# Patient Record
Sex: Female | Born: 1940 | Race: Black or African American | Hispanic: No | Smoking: Never smoker
Health system: Southern US, Community
[De-identification: ages and names within clinical notes are randomized; demographics above are authoritative.]

## PROBLEM LIST (undated history)

## (undated) DIAGNOSIS — I10 Essential (primary) hypertension: Secondary | ICD-10-CM

## (undated) DIAGNOSIS — C801 Malignant (primary) neoplasm, unspecified: Secondary | ICD-10-CM

---

## 2019-02-06 ENCOUNTER — Other Ambulatory Visit: Payer: Self-pay

## 2019-02-06 ENCOUNTER — Encounter: Payer: Self-pay | Admitting: Emergency Medicine

## 2019-02-06 DIAGNOSIS — Z85118 Personal history of other malignant neoplasm of bronchus and lung: Secondary | ICD-10-CM | POA: Diagnosis not present

## 2019-02-06 DIAGNOSIS — Z923 Personal history of irradiation: Secondary | ICD-10-CM | POA: Insufficient documentation

## 2019-02-06 DIAGNOSIS — Z20828 Contact with and (suspected) exposure to other viral communicable diseases: Secondary | ICD-10-CM | POA: Diagnosis not present

## 2019-02-06 DIAGNOSIS — I82B12 Acute embolism and thrombosis of left subclavian vein: Secondary | ICD-10-CM | POA: Diagnosis not present

## 2019-02-06 DIAGNOSIS — J90 Pleural effusion, not elsewhere classified: Secondary | ICD-10-CM | POA: Insufficient documentation

## 2019-02-06 DIAGNOSIS — M6281 Muscle weakness (generalized): Secondary | ICD-10-CM | POA: Diagnosis not present

## 2019-02-06 DIAGNOSIS — I10 Essential (primary) hypertension: Secondary | ICD-10-CM | POA: Insufficient documentation

## 2019-02-06 DIAGNOSIS — Z902 Acquired absence of lung [part of]: Secondary | ICD-10-CM | POA: Insufficient documentation

## 2019-02-06 DIAGNOSIS — I2510 Atherosclerotic heart disease of native coronary artery without angina pectoris: Secondary | ICD-10-CM | POA: Insufficient documentation

## 2019-02-06 DIAGNOSIS — R000 Tachycardia, unspecified: Secondary | ICD-10-CM | POA: Diagnosis not present

## 2019-02-06 DIAGNOSIS — K219 Gastro-esophageal reflux disease without esophagitis: Secondary | ICD-10-CM | POA: Insufficient documentation

## 2019-02-06 DIAGNOSIS — Z79899 Other long term (current) drug therapy: Secondary | ICD-10-CM | POA: Insufficient documentation

## 2019-02-06 DIAGNOSIS — R1013 Epigastric pain: Secondary | ICD-10-CM | POA: Diagnosis present

## 2019-02-06 DIAGNOSIS — R55 Syncope and collapse: Secondary | ICD-10-CM | POA: Diagnosis not present

## 2019-02-06 DIAGNOSIS — R110 Nausea: Secondary | ICD-10-CM | POA: Diagnosis not present

## 2019-02-06 DIAGNOSIS — Z9221 Personal history of antineoplastic chemotherapy: Secondary | ICD-10-CM | POA: Diagnosis not present

## 2019-02-06 DIAGNOSIS — Z8711 Personal history of peptic ulcer disease: Secondary | ICD-10-CM | POA: Diagnosis not present

## 2019-02-06 DIAGNOSIS — R262 Difficulty in walking, not elsewhere classified: Secondary | ICD-10-CM | POA: Insufficient documentation

## 2019-02-06 DIAGNOSIS — R590 Localized enlarged lymph nodes: Secondary | ICD-10-CM | POA: Diagnosis not present

## 2019-02-06 DIAGNOSIS — I700 Atherosclerosis of aorta: Secondary | ICD-10-CM | POA: Insufficient documentation

## 2019-02-06 DIAGNOSIS — R519 Headache, unspecified: Secondary | ICD-10-CM | POA: Insufficient documentation

## 2019-02-06 DIAGNOSIS — E876 Hypokalemia: Secondary | ICD-10-CM | POA: Insufficient documentation

## 2019-02-06 LAB — CBC
HCT: 32.2 % — ABNORMAL LOW (ref 36.0–46.0)
Hemoglobin: 10.3 g/dL — ABNORMAL LOW (ref 12.0–15.0)
MCH: 27.8 pg (ref 26.0–34.0)
MCHC: 32 g/dL (ref 30.0–36.0)
MCV: 86.8 fL (ref 80.0–100.0)
Platelets: 260 10*3/uL (ref 150–400)
RBC: 3.71 MIL/uL — ABNORMAL LOW (ref 3.87–5.11)
RDW: 14.9 % (ref 11.5–15.5)
WBC: 8.9 10*3/uL (ref 4.0–10.5)
nRBC: 0 % (ref 0.0–0.2)

## 2019-02-06 LAB — COMPREHENSIVE METABOLIC PANEL
ALT: 9 U/L (ref 0–44)
AST: 17 U/L (ref 15–41)
Albumin: 3.6 g/dL (ref 3.5–5.0)
Alkaline Phosphatase: 85 U/L (ref 38–126)
Anion gap: 13 (ref 5–15)
BUN: 18 mg/dL (ref 8–23)
CO2: 24 mmol/L (ref 22–32)
Calcium: 9.7 mg/dL (ref 8.9–10.3)
Chloride: 102 mmol/L (ref 98–111)
Creatinine, Ser: 1.07 mg/dL — ABNORMAL HIGH (ref 0.44–1.00)
GFR calc Af Amer: 57 mL/min — ABNORMAL LOW (ref >60)
GFR calc non Af Amer: 49 mL/min — ABNORMAL LOW (ref >60)
Glucose, Bld: 126 mg/dL — ABNORMAL HIGH (ref 70–99)
Potassium: 3.2 mmol/L — ABNORMAL LOW (ref 3.5–5.1)
Sodium: 139 mmol/L (ref 135–145)
Total Bilirubin: 0.6 mg/dL (ref 0.3–1.2)
Total Protein: 9 g/dL — ABNORMAL HIGH (ref 6.5–8.1)

## 2019-02-06 LAB — LIPASE, BLOOD: Lipase: 26 U/L (ref 11–51)

## 2019-02-06 NOTE — ED Notes (Signed)
Daughter, Thayer Headings [phone]   Waiting in car for pt to get room

## 2019-02-06 NOTE — ED Triage Notes (Signed)
First Nurse Note:  Arrives via EMS for c/o abdominal pain and nausea today.  Patient visiting from [facility].  Has history of lung cancer, DM, Gastric ulcer, and HTN.

## 2019-02-06 NOTE — ED Triage Notes (Signed)
PT from home via EMS with abd pain since yesterday. Hx of ulcers. PT denies any n/v/d or bloody stools.

## 2019-02-07 ENCOUNTER — Observation Stay
Admission: EM | Admit: 2019-02-07 | Discharge: 2019-02-09 | Disposition: A | Discharge status: Home / Self Care | Payer: Medicare HMO | Attending: Internal Medicine | Admitting: Internal Medicine

## 2019-02-07 ENCOUNTER — Emergency Department: Payer: Medicare HMO

## 2019-02-07 ENCOUNTER — Other Ambulatory Visit: Payer: Self-pay

## 2019-02-07 ENCOUNTER — Observation Stay (HOSPITAL_BASED_OUTPATIENT_CLINIC_OR_DEPARTMENT_OTHER)
Admit: 2019-02-07 | Discharge: 2019-02-07 | Disposition: A | Discharge status: Home / Self Care | Payer: Medicare HMO | Attending: Internal Medicine | Admitting: Internal Medicine

## 2019-02-07 DIAGNOSIS — R590 Localized enlarged lymph nodes: Secondary | ICD-10-CM | POA: Diagnosis not present

## 2019-02-07 DIAGNOSIS — R55 Syncope and collapse: Secondary | ICD-10-CM

## 2019-02-07 DIAGNOSIS — I10 Essential (primary) hypertension: Secondary | ICD-10-CM

## 2019-02-07 DIAGNOSIS — R000 Tachycardia, unspecified: Secondary | ICD-10-CM

## 2019-02-07 DIAGNOSIS — I361 Nonrheumatic tricuspid (valve) insufficiency: Secondary | ICD-10-CM

## 2019-02-07 DIAGNOSIS — I82B19 Acute embolism and thrombosis of unspecified subclavian vein: Secondary | ICD-10-CM

## 2019-02-07 DIAGNOSIS — R918 Other nonspecific abnormal finding of lung field: Secondary | ICD-10-CM

## 2019-02-07 DIAGNOSIS — I2510 Atherosclerotic heart disease of native coronary artery without angina pectoris: Secondary | ICD-10-CM | POA: Diagnosis not present

## 2019-02-07 DIAGNOSIS — E876 Hypokalemia: Secondary | ICD-10-CM

## 2019-02-07 DIAGNOSIS — I82B12 Acute embolism and thrombosis of left subclavian vein: Secondary | ICD-10-CM | POA: Diagnosis not present

## 2019-02-07 DIAGNOSIS — R1013 Epigastric pain: Secondary | ICD-10-CM

## 2019-02-07 HISTORY — DX: Essential (primary) hypertension: I10

## 2019-02-07 HISTORY — DX: Malignant (primary) neoplasm, unspecified: C80.1

## 2019-02-07 LAB — GLUCOSE, CAPILLARY
Glucose-Capillary: 100 mg/dL — ABNORMAL HIGH (ref 70–99)
Glucose-Capillary: 110 mg/dL — ABNORMAL HIGH (ref 70–99)
Glucose-Capillary: 88 mg/dL (ref 70–99)

## 2019-02-07 LAB — HEMOGLOBIN A1C
Hgb A1c MFr Bld: 5.8 % — ABNORMAL HIGH (ref 4.8–5.6)
Mean Plasma Glucose: 119.76 mg/dL

## 2019-02-07 LAB — PROTIME-INR
INR: 1.1 (ref 0.8–1.2)
Prothrombin Time: 14.4 seconds (ref 11.4–15.2)

## 2019-02-07 LAB — ECHOCARDIOGRAM COMPLETE

## 2019-02-07 LAB — TROPONIN I (HIGH SENSITIVITY)
Troponin I (High Sensitivity): 9 ng/L (ref <18)
Troponin I (High Sensitivity): 10 ng/L (ref <18)

## 2019-02-07 LAB — TSH: TSH: 0.86 u[IU]/mL (ref 0.350–4.500)

## 2019-02-07 LAB — APTT: aPTT: 47 seconds — ABNORMAL HIGH (ref 24–36)

## 2019-02-07 IMAGING — US US EXTREM  UP VENOUS*L*
1 series · 13 of 24 positions shown · non-contrast
Comparison: CT angiography [DATE]

CLINICAL DATA: Possible DVT on CT



[Series 1: us extrem up venous*left* · 0.08mm/px · 13 of 40 slices shown]
[im 1/40]
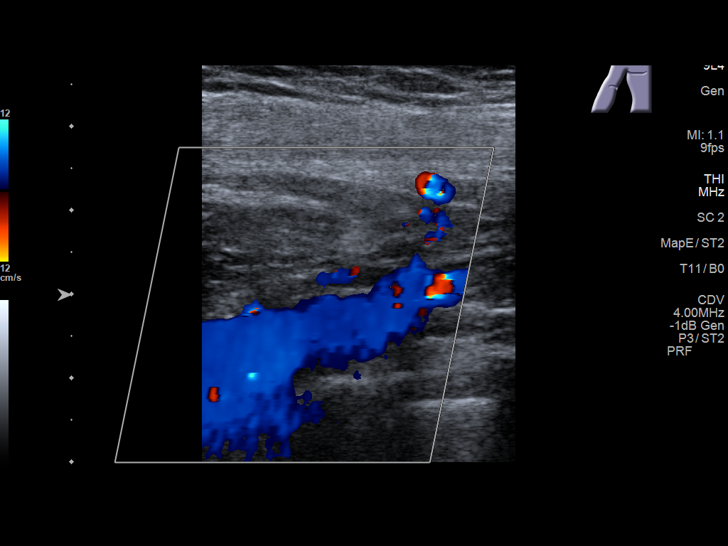
[im 4/40]
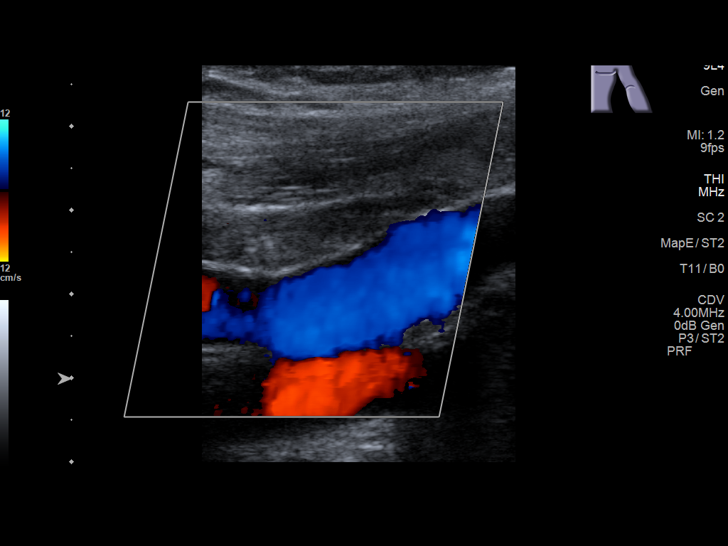
[im 7/40]
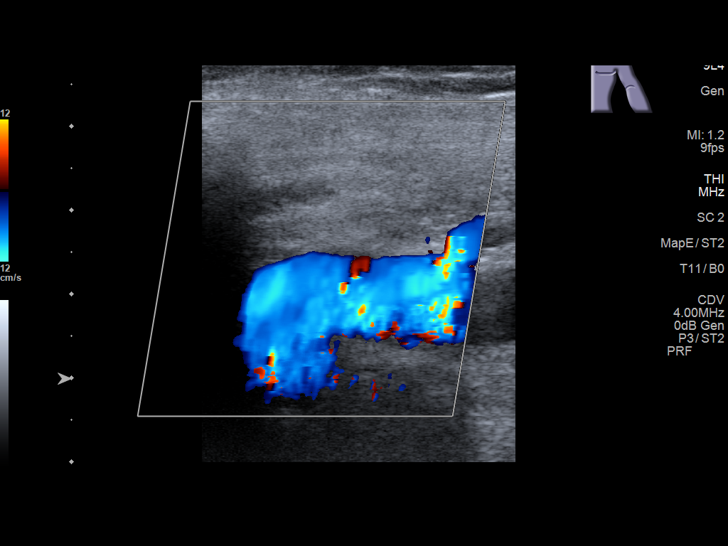
[im 11/40]
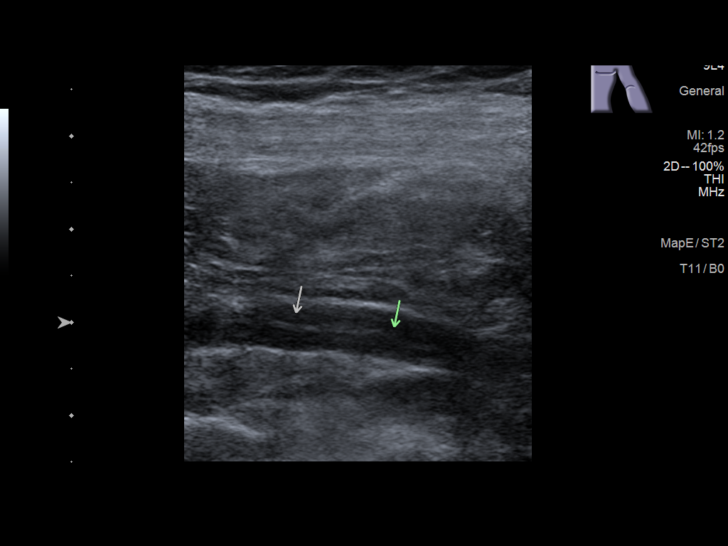
[im 14/40]
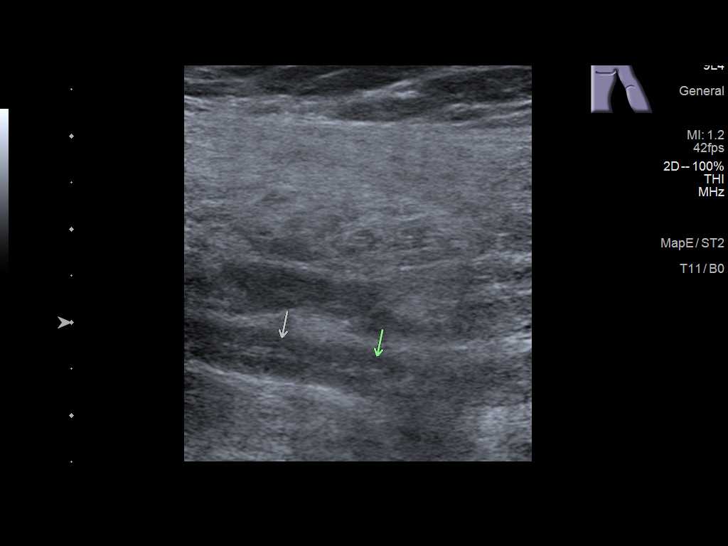
[im 17/40]
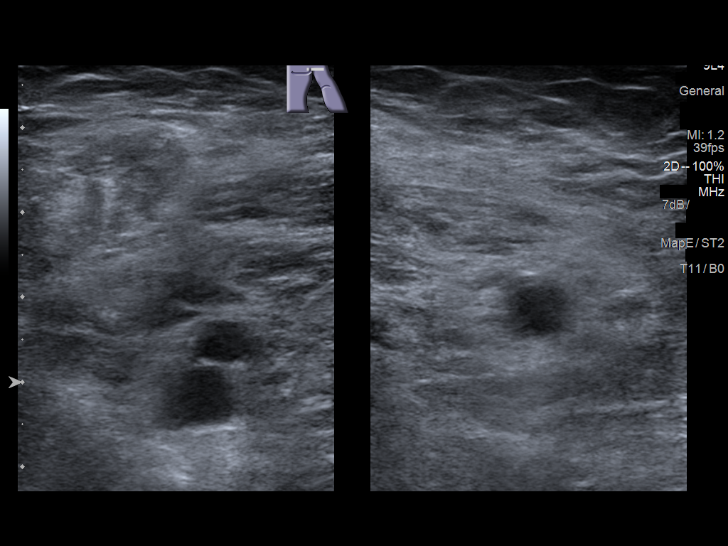
[im 21/40]
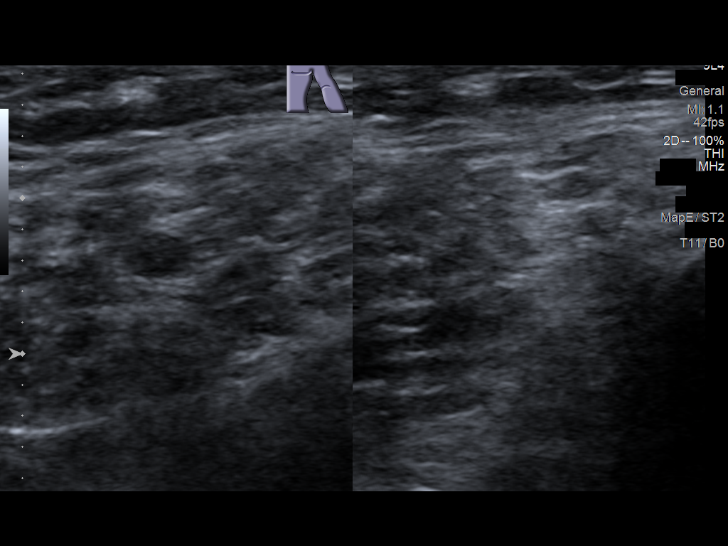
[im 23/40]
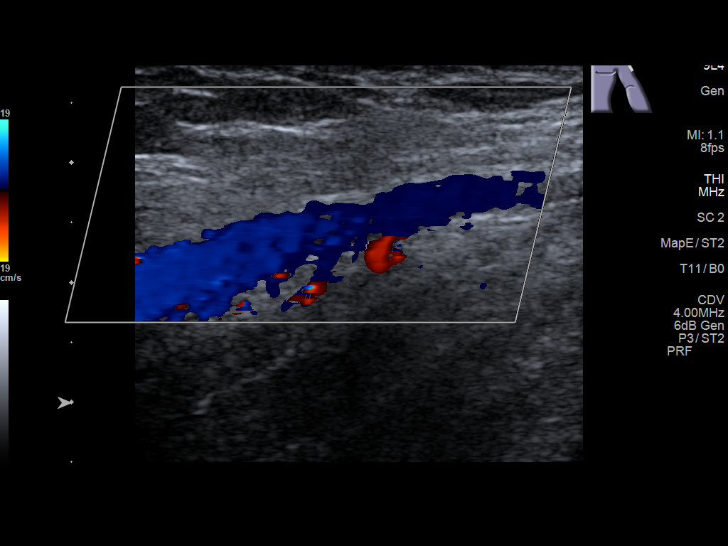
[im 26/40]
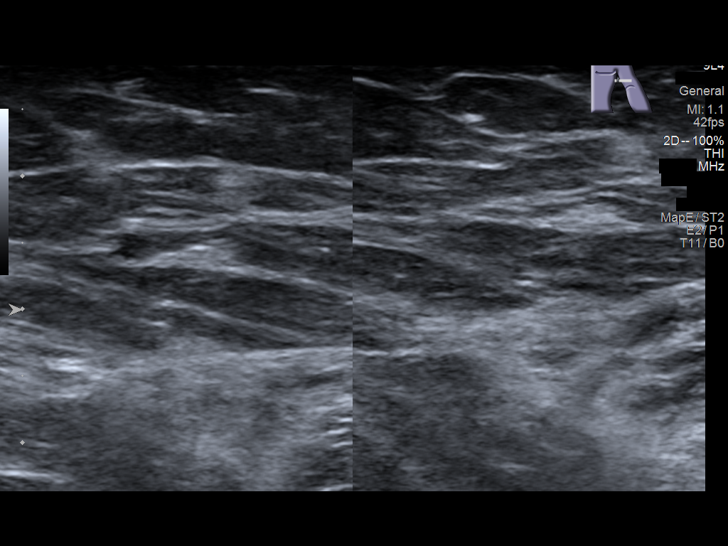
[im 29/40]
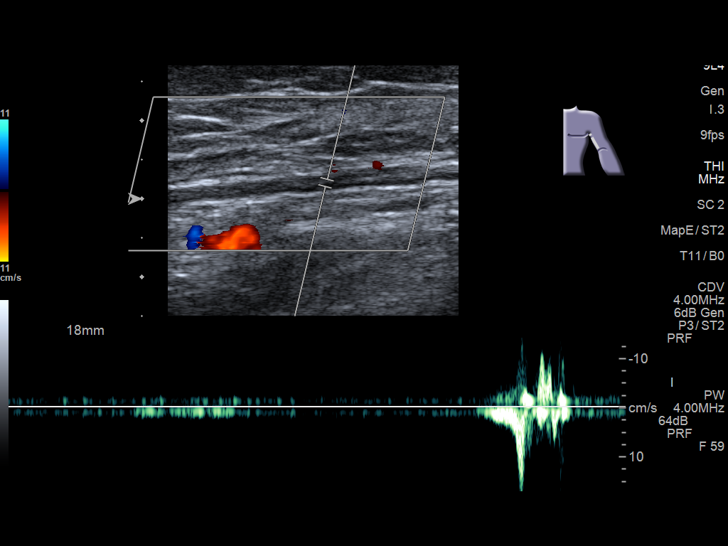
[im 33/40]
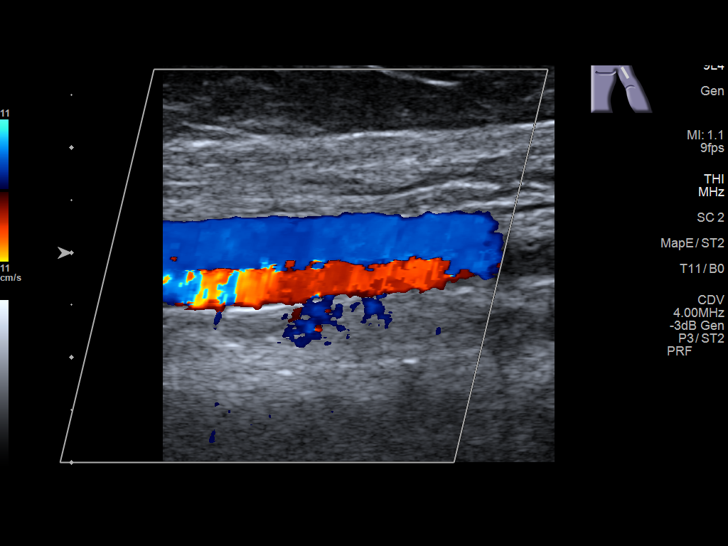
[im 36/40]
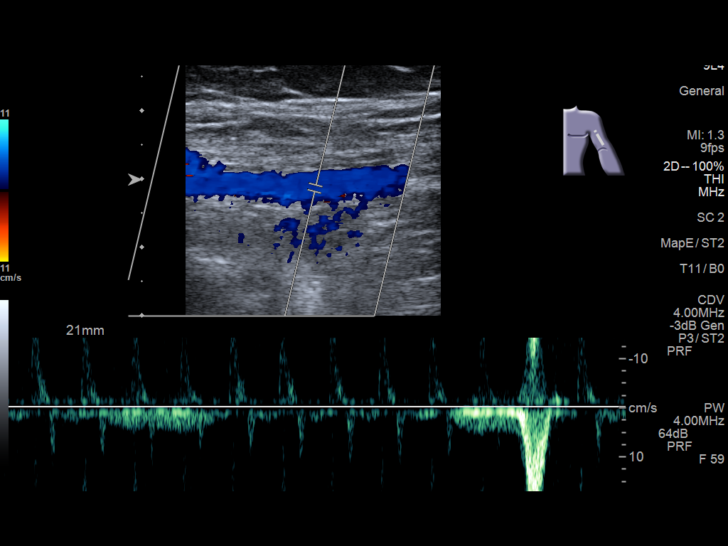
[im 40/40]
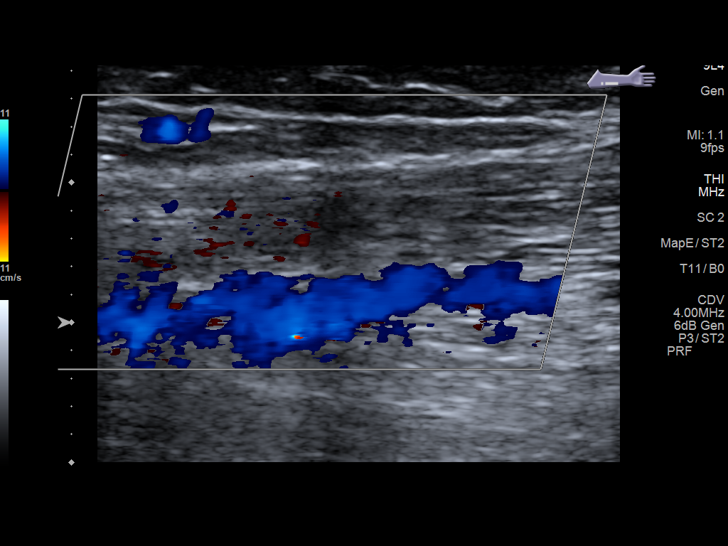

[13 of 24 positions shown; findings below may reference images not displayed]

FINDINGS: Contralateral Subclavian Vein: Respiratory phasicity is normal and
symmetric with the symptomatic side. No evidence of thrombus. Normal
compressibility.

Internal Jugular Vein: No evidence of thrombus. Normal
compressibility, respiratory phasicity and response to augmentation.

Subclavian Vein: There is some echogenic, nonocclusive thrombus in
the left subclavian vein. Extension to the IJ is not visualized on
this sonographic exam. Normal respiratory phasicity.

Axillary Vein: No evidence of thrombus. Normal compressibility,
respiratory phasicity and response to augmentation.

Cephalic Vein: No evidence of thrombus. Normal compressibility,
respiratory phasicity and response to augmentation.

Basilic Vein: No evidence of thrombus. Normal compressibility,
respiratory phasicity and response to augmentation.

Brachial Veins: No evidence of thrombus. Normal compressibility,
respiratory phasicity and response to augmentation.

Radial Veins: No evidence of thrombus. Normal compressibility,
respiratory phasicity and response to augmentation.

Ulnar Veins: No evidence of thrombus. Normal compressibility,
respiratory phasicity and response to augmentation.

Venous Reflux:  None visualized.

Other Findings:  None visualized.
IMPRESSION: Echogenic nonocclusive thrombus is seen at the left subclavian vein.
Increased echogenicity in linear configuration may reflect a more
chronic thrombus or potential fibrin sheath patient has had a
history long-term venous access though acute clot cannot be fully
excluded.

These results were called by telephone at the time of interpretation
on [DATE] at [DATE] to provider ISTAKHAR , who verbally
acknowledged these results.

## 2019-02-07 MED ORDER — ONDANSETRON HCL 4 MG/2ML IJ SOLN
4.0000 mg | Freq: Once | INTRAMUSCULAR | Status: AC
  Administered 2019-02-07: 4 mg via INTRAVENOUS
  Filled 2019-02-07: qty 2

## 2019-02-07 MED ORDER — INSULIN ASPART 100 UNIT/ML ~~LOC~~ SOLN: 0.0000 [IU] | Freq: Three times a day (TID) | SUBCUTANEOUS | Status: DC

## 2019-02-07 MED ORDER — BENAZEPRIL HCL 20 MG PO TABS
20.0000 mg | ORAL_TABLET | Freq: Every day | ORAL | Status: DC
  Administered 2019-02-07 – 2019-02-09 (×3): 20 mg via ORAL
  Filled 2019-02-07 (×3): qty 1

## 2019-02-07 MED ORDER — HYDROCHLOROTHIAZIDE 25 MG PO TABS: 12.5000 mg | ORAL_TABLET | Freq: Every day | ORAL | Status: DC

## 2019-02-07 MED ORDER — INSULIN ASPART 100 UNIT/ML ~~LOC~~ SOLN: 0.0000 [IU] | Freq: Every day | SUBCUTANEOUS | Status: DC

## 2019-02-07 MED ORDER — ONDANSETRON HCL 4 MG/2ML IJ SOLN
4.0000 mg | Freq: Four times a day (QID) | INTRAMUSCULAR | Status: DC | PRN
  Administered 2019-02-08 – 2019-02-09 (×3): 4 mg via INTRAVENOUS
  Filled 2019-02-07 (×3): qty 2

## 2019-02-07 MED ORDER — HEPARIN (PORCINE) 25000 UT/250ML-% IV SOLN
1300.0000 [IU]/h | INTRAVENOUS | Status: DC
  Administered 2019-02-07: 1300 [IU]/h via INTRAVENOUS
  Filled 2019-02-07: qty 250

## 2019-02-07 MED ORDER — POTASSIUM CHLORIDE CRYS ER 20 MEQ PO TBCR
40.0000 meq | EXTENDED_RELEASE_TABLET | Freq: Once | ORAL | Status: AC
  Administered 2019-02-07: 40 meq via ORAL
  Filled 2019-02-07: qty 2

## 2019-02-07 MED ORDER — INSULIN ASPART 100 UNIT/ML ~~LOC~~ SOLN
4.0000 [IU] | Freq: Three times a day (TID) | SUBCUTANEOUS | Status: DC
  Filled 2019-02-07: qty 1

## 2019-02-07 MED ORDER — ENOXAPARIN SODIUM 100 MG/ML ~~LOC~~ SOLN
1.0000 mg/kg | Freq: Two times a day (BID) | SUBCUTANEOUS | Status: DC
  Administered 2019-02-07 – 2019-02-09 (×4): 85 mg via SUBCUTANEOUS
  Filled 2019-02-07 (×7): qty 1

## 2019-02-07 MED ORDER — HEPARIN BOLUS VIA INFUSION
5000.0000 [IU] | Freq: Once | INTRAVENOUS | Status: AC
  Administered 2019-02-07: 5000 [IU] via INTRAVENOUS
  Filled 2019-02-07: qty 5000

## 2019-02-07 MED ORDER — SENNOSIDES-DOCUSATE SODIUM 8.6-50 MG PO TABS
1.0000 | ORAL_TABLET | Freq: Every evening | ORAL | Status: DC | PRN
  Administered 2019-02-08: 1 via ORAL
  Filled 2019-02-07: qty 1

## 2019-02-07 MED ORDER — FAMOTIDINE IN NACL 20-0.9 MG/50ML-% IV SOLN
20.0000 mg | Freq: Once | INTRAVENOUS | Status: AC
  Administered 2019-02-07: 20 mg via INTRAVENOUS
  Filled 2019-02-07: qty 50

## 2019-02-07 MED ORDER — ACETAMINOPHEN 325 MG PO TABS
650.0000 mg | ORAL_TABLET | Freq: Four times a day (QID) | ORAL | Status: DC | PRN
  Administered 2019-02-07 – 2019-02-08 (×3): 650 mg via ORAL
  Filled 2019-02-07 (×3): qty 2

## 2019-02-07 MED ORDER — PANTOPRAZOLE SODIUM 40 MG PO TBEC
40.0000 mg | DELAYED_RELEASE_TABLET | Freq: Every day | ORAL | Status: DC
  Administered 2019-02-07 – 2019-02-09 (×3): 40 mg via ORAL
  Filled 2019-02-07 (×3): qty 1

## 2019-02-07 MED ORDER — SUCRALFATE 1 G PO TABS
1.0000 g | ORAL_TABLET | Freq: Four times a day (QID) | ORAL | Status: DC
  Administered 2019-02-07 – 2019-02-09 (×8): 1 g via ORAL
  Filled 2019-02-07 (×7): qty 1

## 2019-02-07 MED ORDER — SODIUM CHLORIDE 0.9 % IV SOLN
INTRAVENOUS | Status: AC
  Administered 2019-02-07: 11:00:00 via INTRAVENOUS

## 2019-02-07 MED ORDER — BISACODYL 5 MG PO TBEC: 5.0000 mg | DELAYED_RELEASE_TABLET | Freq: Every day | ORAL | Status: DC | PRN

## 2019-02-07 MED ORDER — IOHEXOL 350 MG/ML SOLN
75.0000 mL | Freq: Once | INTRAVENOUS | Status: AC | PRN
  Administered 2019-02-07: 75 mL via INTRAVENOUS

## 2019-02-07 MED ORDER — ONDANSETRON HCL 4 MG PO TABS
4.0000 mg | ORAL_TABLET | Freq: Four times a day (QID) | ORAL | Status: DC | PRN
  Administered 2019-02-09: 4 mg via ORAL
  Filled 2019-02-07: qty 1

## 2019-02-07 NOTE — Plan of Care (Signed)
78 yo female with hypertension, cancer s/p right upper lobectomy, visiting for Thanksgiving c/o abdominal pain   CTA chest -> negative for PE w filling defect  In the left subclavian vein worrisome for venous thrombosis, and numerous spiculated masses in the lungs   ED requesting admission for possible left subclavian vein dvt and numerous spiculated masses in the lungs

## 2019-02-07 NOTE — Progress Notes (Signed)
PT Cancellation Note  Patient Details Name: Veronica Grimes MRN: [id number] DOB: 1940/11/10   Cancelled Treatment:    Reason Eval/Treat Not Completed: Other (comment). Pt has been at hospital for 8hours, no H&P in system yet. Of note, pt with + L subclavian vein DVT, - PE. Per pharm note, started on anticoag at [phone] and plans to switch to enoxaparin at 1000. Pt with elevated HR at 126 bpm at this time. Due to all of the above, will wait to initiate therapy until medical POC more established. Will re-attempt next date.   Rosita Guzzetta 02/07/2019, 10:24 AM  Greggory Stallion, PT, DPT [phone]

## 2019-02-07 NOTE — Progress Notes (Signed)
OT Cancellation Note  Patient Details Name: Veronica Grimes MRN: [id number] DOB: 12/09/1940   Cancelled Treatment:    Reason Eval/Treat Not Completed: Other (comment)  OT order received and chart reviewed. Pt with + L subclavian vein DVT, - PE. Per pharm note, started on anticoag at [phone] and plans to switch to enoxaparin at 1000. Upon chart review, pt with elevated HR at 126 bpm at this time. Will wait to initiate therapy until pt more appropriate. Will re-attempt next date.  Gerrianne Scale, MS, OTR/L ascom [phone] or [phone] 02/07/19, 3:07 PM

## 2019-02-07 NOTE — ED Notes (Signed)
Turned off Heparin infusion per pharmacy, per Dr Reesa Chew.

## 2019-02-07 NOTE — H&P (Signed)
History and Physical    Veronica Grimes DOB: 1941/03/06 DOA: 02/07/2019  PCP: Patient, No Pcp Per Patient coming from: Motel  Chief Complaint: Syncope  HPI: Veronica Grimes is a 78 y.o. female with medical history significant of lung cancer status post lobectomy/chemo, essential hypertension was brought to the hospital for evaluation of syncope.  Patient is visiting out of town and is living in a motel where her family thought she passed out and called EMS.  Apparently just before this she had reported some epigastric pain after eating some Popeye.  No other complaints.  In the ER her basic lab work showed hypokalemia.  CTA of the chest and carotid Dopplers were positive for left subclavian vein thrombosis.  It also showed previous lobectomy with spiculated mass.  She was started on anticoagulation and medical team was requested to admit the patient.  When I saw her she was comfortable and did not have any new complaints.  Her nausea  and abdominal discomfort have resolved.  Previously her lung cancer was treated in [place] at least 5 years ago, she had also underwent lobectomy.  According to her she has been in remission   Review of Systems: As per HPI otherwise 10 point review of systems negative.  Review of Systems Otherwise negative except as per HPI, including: General: Denies fever, chills, night sweats or unintended weight loss. Resp: Denies cough, wheezing, shortness of breath. Cardiac: Denies chest pain, palpitations, orthopnea, paroxysmal nocturnal dyspnea. GI: Denies  diarrhea or constipation GU: Denies dysuria, frequency, hesitancy or incontinence MS: Denies muscle aches, joint pain or swelling Neuro: Denies headache, neurologic deficits (focal weakness, numbness, tingling), abnormal gait Psych: Denies anxiety, depression, SI/HI/AVH Skin: Denies new rashes or lesions ID: Denies sick contacts, exotic exposures, travel  Past Medical History:  Diagnosis Date    Cancer ([place])    Hypertension     History reviewed. No pertinent surgical history.  SOCIAL HISTORY:  reports that she has never smoked. She has never used smokeless tobacco. She reports that she does not drink alcohol or use drugs.  No Known Allergies  FAMILY HISTORY: No family history on file.   Prior to Admission medications   Medication Sig Start Date End Date Taking? Authorizing Provider  benazepril (LOTENSIN) 20 MG tablet Take 20 mg by mouth daily.  10/28/17  Yes [provider]  hydrochlorothiazide (HYDRODIURIL) 12.5 MG tablet Take 12.5 mg by mouth daily. 10/01/18  Yes [provider]  omeprazole (PRILOSEC) 20 MG capsule Take 20 mg by mouth daily. 10/02/18  Yes [provider]  promethazine (PHENERGAN) 25 MG tablet Take 25 mg by mouth every 4 (four) hours as needed.  10/28/17  Yes [provider]  sucralfate (CARAFATE) 1 g tablet Take 1 g by mouth 4 (four) times daily. 09/24/18  Yes [provider]    Physical Exam: Vitals:   02/07/19 0730 02/07/19 0800 02/07/19 0843 02/07/19 0908  BP: (!) 157/95 (!) 168/112 (!) 173/73 (!) 144/86  Pulse: (!) 116 94 98 (!) 126  Resp: 15 (!) 28 19 18   Temp:   98.9 F (37.2 C) 98.2 F (36.8 C)  TempSrc:   Oral Axillary  SpO2: 95% 96% 96% 100%  Weight:      Height:          Constitutional: NAD, calm, comfortable Eyes: PERRL, lids and conjunctivae normal ENMT: Mucous membranes are moist. Posterior pharynx clear of any exudate or lesions.Normal dentition.  Neck: normal, supple, no masses, no thyromegaly Respiratory: clear  to auscultation bilaterally, no wheezing, no crackles. Normal respiratory effort. No accessory muscle use.  Cardiovascular: Regular rate and rhythm, no murmurs / rubs / gallops. No extremity edema. 2+ pedal pulses. No carotid bruits.  Abdomen: no tenderness, no masses palpated. No hepatosplenomegaly. Bowel sounds positive.  Musculoskeletal: no clubbing / cyanosis. No joint  deformity upper and lower extremities. Good ROM, no contractures. Normal muscle tone.  Skin: no rashes, lesions, ulcers. No induration Neurologic: CN 2-12 grossly intact. Sensation intact, DTR normal. Strength 5/5 in all 4.  Psychiatric: Normal judgment and insight. Alert and oriented x 3. Normal mood.     Labs on Admission: I have personally reviewed following labs and imaging studies  CBC: Recent Labs  Lab 02/06/19 1919  WBC 8.9  HGB 10.3*  HCT 32.2*  MCV 86.8  PLT 123456   Basic Metabolic Panel: Recent Labs  Lab 02/06/19 1919  NA 139  K 3.2*  CL 102  CO2 24  GLUCOSE 126*  BUN 18  CREATININE 1.07*  CALCIUM 9.7   GFR: Estimated Creatinine Clearance: 47 mL/min (A) (by C-G formula based on SCr of 1.07 mg/dL (H)). Liver Function Tests: Recent Labs  Lab 02/06/19 1919  AST 17  ALT 9  ALKPHOS 85  BILITOT 0.6  PROT 9.0*  ALBUMIN 3.6   Recent Labs  Lab 02/06/19 1919  LIPASE 26   No results for input(s): AMMONIA in the last 168 hours. Coagulation Profile: Recent Labs  Lab 02/07/19 0643  INR 1.1   Cardiac Enzymes: No results for input(s): CKTOTAL, CKMB, CKMBINDEX, TROPONINI in the last 168 hours. BNP (last 3 results) No results for input(s): PROBNP in the last 8760 hours. HbA1C: No results for input(s): HGBA1C in the last 72 hours. CBG: No results for input(s): GLUCAP in the last 168 hours. Lipid Profile: No results for input(s): CHOL, HDL, LDLCALC, TRIG, CHOLHDL, LDLDIRECT in the last 72 hours. Thyroid Function Tests: Recent Labs    02/07/19 0915  TSH 0.860   Anemia Panel: No results for input(s): VITAMINB12, FOLATE, FERRITIN, TIBC, IRON, RETICCTPCT in the last 72 hours. Urine analysis: No results found for: COLORURINE, APPEARANCEUR, LABSPEC, PHURINE, GLUCOSEU, HGBUR, BILIRUBINUR, KETONESUR, PROTEINUR, UROBILINOGEN, NITRITE, LEUKOCYTESUR Sepsis Labs: !!!!!!!!!!!!!!!!!!!!!!!!!!!!!!!!!!!!!!!!!!!! @LABRCNTIP (procalcitonin:4,lacticidven:4) )No results  found for this or any previous visit (from the past 240 hour(s)).   Radiological Exams on Admission: Dg Chest 2 View  Result Date: 02/07/2019 CLINICAL DATA:  Chest pain, history of lung cancer EXAM: CHEST - 2 VIEW COMPARISON:  None. FINDINGS: There are postsurgical changes in the right lung with more masslike areas of opacity in the right upper lobe and left mid lung. There is some more patchy airspace opacity present as well. There is a loculated right pleural effusion tracking within the fissures and with resulting apical capping. Mild cardiomegaly. No pneumothorax. Cholecystectomy clips in the right upper quadrant. No acute osseous or soft tissue abnormality. Degenerative changes are present in the imaged spine and shoulders. IMPRESSION: 1. Postoperative changes in the right lung likely from prior lobectomy are wedge resection. Correlate with surgical history. 2. Masslike areas of opacity in the right upper lobe and left mid lung worrisome for malignancy given patient's history of lung cancer. Additional ill-defined areas of opacity could suggest infection as well. 3. Loculated right pleural effusion tracking within the fissures and resulting apical capping. Electronically Signed   By: Lovena Le M.D.   On: 02/07/2019 02:28   Ct Angio Chest Pe W/cm &/or Wo Cm  Result Date: 02/07/2019 CLINICAL DATA:  Rule out PE, abnormal chest CT EXAM: CT ANGIOGRAPHY CHEST WITH CONTRAST TECHNIQUE: Multidetector CT imaging of the chest was performed using the standard protocol during bolus administration of intravenous contrast. Multiplanar CT image reconstructions and MIPs were obtained to evaluate the vascular anatomy. CONTRAST:  42mL OMNIPAQUE IOHEXOL 350 MG/ML SOLN COMPARISON:  Same-day radiograph FINDINGS: Cardiovascular: Satisfactory opacification of the pulmonary arteries to the segmental level. No pulmonary arterial filling defects are identified. There is central pulmonary artery enlargement but without  elevation of the RV/LV ratio (0.8). Mild cardiomegaly. Coronary artery calcifications are noted. Small volume of pericardial fluid. Calcifications on the aortic leaflets. Normal caliber thoracic aorta. Atherosclerotic calcification present throughout the thoracic aorta and normally branching three-vessel arch. Note is made of extensive venous reflux of contrast in the mediastinum. Injection of contrast is from the left upper extremity. There is a smooth linear filling defect in the region of the left subclavian vein worrisome for venous thrombosis. No other filling defects are identified. Mediastinum/Nodes: There is extensive mediastinal and hilar adenopathy including a large conglomerate subcarinal nodal mass measuring up to 2.8 cm (4/42) and additional right paratracheal lymph node measuring up to 11 mm (4/19) and some associated hilar nodal masses. There are postsurgical changes with truncation right upper lobe pulmonary artery and vein suggesting prior right upper lobectomy. Extensive confluent, spiculated soft tissue attenuation extends from the resection margin into the lung tissue measuring approximately 4.3 x 3.9 cm in size. Lungs/Pleura: There are multifocal masslike opacities throughout the lungs including a partially cavitary lesion in the superior segment left lower lobe measuring up to 1.7 cm in size (4/52) largest lesion is a bilobed subpleural mass in the superior segment right lower lobe measuring up to 4.9 cm in maximal diameter. Additional masses are seen throughout the remaining lobes of the lung as well as along the mediastinal border in the left upper lobe (4/24). No convincing features of superimposed infection at this time. Upper Abdomen: Patient is post cholecystectomy. Few fluid attenuation cystic lesions are present in the right kidney. No acute abnormalities present in the visualized portions of the upper abdomen. Musculoskeletal: The osseous structures appear diffusely demineralized which  may limit detection of small or nondisplaced fractures. Multilevel degenerative changes are present in the imaged portions of the spine. Deformity of the right posterior fifth and sixth ribs may be related to prior thoracotomy. Review of the MIP images confirms the above findings. IMPRESSION: 1. No evidence of pulmonary artery emboli. 2. Linear filling defect seen in the left subclavian vein potentially extending from the left internal jugular vein. Findings worrisome for venous thrombosis though a laminar flow artifact could present with a similar appearance. Consider further evaluation with left upper extremity venous Doppler 3. Evidence of what appears to be a prior right upper lobectomy with extensive spiculated soft tissue attenuation along the resection margin concerning for locally recurrent disease. Numerous additional spiculated masses and nodules throughout the remaining portions of the lungs are compatible with metastatic disease. Additional mediastinal hilar adenopathy is noted as well. 4. Aortic atherosclerosis. Coronary artery disease. 5. Deformity of the right posterior fifth and sixth ribs may be related to prior thoracotomy. Aortic Atherosclerosis (ICD10-I70.0). These results were called by telephone at the time of interpretation on 02/07/2019 at 3:57 am to provider [facility] , who verbally acknowledged these results. Electronically Signed   By: Lovena Le M.D.   On: 02/07/2019 03:58   US Venous Img Upper Left (dvt Study)  Result Date: 02/07/2019 CLINICAL DATA:  Possible DVT on CT EXAM: LEFT UPPER EXTREMITY VENOUS DOPPLER ULTRASOUND TECHNIQUE: Gray-scale sonography with graded compression, as well as color Doppler and duplex ultrasound were performed to evaluate the upper extremity deep venous system from the level of the subclavian vein and including the jugular, axillary, basilic, radial, ulnar and upper cephalic vein. Spectral Doppler was utilized to evaluate flow at rest and with distal  augmentation maneuvers. COMPARISON:  CT angiography 02/07/2019 FINDINGS: Contralateral Subclavian Vein: Respiratory phasicity is normal and symmetric with the symptomatic side. No evidence of thrombus. Normal compressibility. Internal Jugular Vein: No evidence of thrombus. Normal compressibility, respiratory phasicity and response to augmentation. Subclavian Vein: There is some echogenic, nonocclusive thrombus in the left subclavian vein. Extension to the IJ is not visualized on this sonographic exam. Normal respiratory phasicity. Axillary Vein: No evidence of thrombus. Normal compressibility, respiratory phasicity and response to augmentation. Cephalic Vein: No evidence of thrombus. Normal compressibility, respiratory phasicity and response to augmentation. Basilic Vein: No evidence of thrombus. Normal compressibility, respiratory phasicity and response to augmentation. Brachial Veins: No evidence of thrombus. Normal compressibility, respiratory phasicity and response to augmentation. Radial Veins: No evidence of thrombus. Normal compressibility, respiratory phasicity and response to augmentation. Ulnar Veins: No evidence of thrombus. Normal compressibility, respiratory phasicity and response to augmentation. Venous Reflux:  None visualized. Other Findings:  None visualized. IMPRESSION: Echogenic nonocclusive thrombus is seen at the left subclavian vein. Increased echogenicity in linear configuration may reflect a more chronic thrombus or potential fibrin sheath patient has had a history long-term venous access though acute clot cannot be fully excluded. These results were called by telephone at the time of interpretation on 02/07/2019 at 5:55 am to provider [facility] , who verbally acknowledged these results. Electronically Signed   By: Lovena Le M.D.   On: 02/07/2019 05:57     All images have been reviewed by me personally.  EKG: Independently reviewed.  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  She agrees to this. -CT of the chest reviewed -Subcu Lovenox -As needed bronchodilators.  Supportive care. -Carotid ultrasound-nonocclusive thrombus in left subclavian -Echocardiogram -Gentle hydration -PT/OT  Patient does not want any aggressive treatment here as she is from out of town and would like to follow-up with her providers in [place].  Hypokalemia -Repletion ordered  Sinus tachycardia -Suspect secondary to ongoing issues as stated above -Gentle hydration.  Hold off on HCTZ.  GERD -PPI  DVT prophylaxis: Lovenox Code Status: Full code Family Communication: None at bedside Disposition Plan: To be determined Consults called: None Admission status: Observation admission for anticoagulation and basic evaluation for syncope   Time Spent: 65 minutes.  >50% of the time was devoted to discussing the patients care, assessment, plan and disposition with other care givers along with counseling the patient about the risks and benefits of treatment.    Joniece Smotherman Arsenio Loader MD [facility]  If 7PM-7AM, please contact night-coverage   02/07/2019, 10:29 AM

## 2019-02-07 NOTE — ED Provider Notes (Addendum)
[facility] Emergency Department Provider Note  ____________________________________________   First MD Initiated Contact with Patient 02/07/19 0129     (approximate)  I have reviewed the triage vital signs and the nursing notes.  History  Chief Complaint Abdominal Pain    HPI Veronica Grimes is a 78 y.o. female with a history of lung cancer status post resection (in 2011, in [place], also status post chemo/rads), hypertension, gastric ulcer who presents emergency department for epigastric pain.  Patient states her pain started today after eating some Popeyes.  Associated with nausea.  She attempted to vomit to see if that would bring relief, but was unable to.  Pain is located in the epigastrium, 9/10 in severity, sharp.  No radiation, no alleviating factors.  She denies any associated chest pain, shortness of breath, cough, fevers, sick contacts.  She denies any vomiting, diarrhea, bloody stools.   Past Medical Hx Past Medical History:  Diagnosis Date  . Cancer ([place])   . Hypertension     Problem List There are no active problems to display for this patient.   Past Surgical Hx History reviewed. No pertinent surgical history.  Medications Prior to Admission medications   Not on File    Allergies Patient has no known allergies.  Family Hx No family history on file.  Social Hx Social History   Tobacco Use  . Smoking status: Never Smoker  . Smokeless tobacco: Never Used  Substance Use Topics  . Alcohol use: Never    Frequency: Never  . Drug use: Never     Review of Systems  Constitutional: Negative for fever, chills. Eyes: Negative for visual changes. ENT: Negative for sore throat. Cardiovascular: Negative for chest pain. Respiratory: Negative for shortness of breath. Gastrointestinal: Negative for nausea, vomiting. + epigastric pain Genitourinary: Negative for dysuria. Musculoskeletal: Negative for leg swelling. Skin: Negative for  rash. Neurological: Negative for for headaches.   Physical Exam  Vital Signs: ED Triage Vitals  Enc Vitals Group     BP 02/06/19 1854 (!) 144/70     Pulse Rate 02/06/19 1854 (!) 112     Resp 02/06/19 1854 20     Temp 02/06/19 1854 98.6 F (37 C)     Temp Source 02/06/19 1854 Oral     SpO2 02/06/19 1854 98 %     Weight 02/06/19 1853 189 lb (85.7 kg)     Height 02/06/19 1853 5\' 6"  (1.676 m)     Head Circumference --      Peak Flow --      Pain Score 02/06/19 1916 9     Pain Loc --      Pain Edu? --      Excl. in [place]? --     Constitutional: Alert and oriented.  Head: Normocephalic. Atraumatic. Eyes: Conjunctivae clear. Sclera anicteric. Nose: No congestion. No rhinorrhea. Mouth/Throat: Wearing mask.  Neck: No stridor.   Cardiovascular: Normal rate, regular rhythm. Extremities well perfused. Respiratory: Normal respiratory effort.  Lungs CTAB. Gastrointestinal: Soft. Mild tenderness in epigastrium. No rebound or guarding. Non-distended.  Musculoskeletal: No lower extremity edema. No deformities. Neurologic:  Normal speech and language. No gross focal neurologic deficits are appreciated.  Skin: Skin is warm, dry and intact. No rash noted. Psychiatric: Mood and affect are appropriate for situation.  EKG  Personally reviewed.   Rate: 96 Rhythm: sinus Axis: borderline leftward Intervals: prolonged PR No acute ischemic changes No STEMI    Radiology  CXR: IMPRESSION:  1. Postoperative changes  in the right lung likely from prior  lobectomy are wedge resection. Correlate with surgical history.  2. Masslike areas of opacity in the right upper lobe and left mid  lung worrisome for malignancy given patient's history of lung  cancer. Additional ill-defined areas of opacity could suggest  infection as well.  3. Loculated right pleural effusion tracking within the fissures and  resulting apical capping.   CT: IMPRESSION:  1. No evidence of pulmonary artery emboli.  2.  Linear filling defect seen in the left subclavian vein  potentially extending from the left internal jugular vein. Findings  worrisome for venous thrombosis though a laminar flow artifact could  present with a similar appearance. Consider further evaluation with  left upper extremity venous Doppler  3. Evidence of what appears to be a prior right upper lobectomy with  extensive spiculated soft tissue attenuation along the resection  margin concerning for locally recurrent disease. Numerous additional  spiculated masses and nodules throughout the remaining portions of  the lungs are compatible with metastatic disease. Additional  mediastinal hilar adenopathy is noted as well.  4. Aortic atherosclerosis. Coronary artery disease.  5. Deformity of the right posterior fifth and sixth ribs may be  related to prior thoracotomy.    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  Increased echogenicity in linear configuration may reflect a more  chronic thrombus or potential fibrin sheath patient has had a  history long-term venous access though acute clot cannot be fully  excluded.    Procedures  Procedure(s) performed (including critical care):  .Critical Care Performed by: Lilia Pro., MD Authorized by: Lilia Pro., MD   Critical care provider statement:    Critical care time (minutes):  30   Critical care was time spent personally by me on the following activities:  Discussions with consultants, evaluation of patient's response to treatment, examination of patient, ordering and performing treatments and interventions, ordering and review of laboratory studies, ordering and review of radiographic studies, pulse oximetry, re-evaluation of patient's condition, obtaining history from patient or surrogate and review of old charts     Initial Impression / Assessment and Plan / ED Course  78 y.o. female who presents to the ED for epigastric  pain, as above.  Ddx: gastritis, esophagitis, atypical ACS, pancreatitis  Troponin x 2 negative.  LFTs, lipase within normal limits.  Unfortunately, her chest x-ray findings are concerning for recurrence of malignancy in the setting of the patient's history of lung cancer.  Follow-up CT scan does note extensive spiculated soft tissue findings concerning for locally recurrent disease as well as additional findings compatible with metastatic disease.  Also with a filling defect in the left subclavian vein, worrisome for potential venous thrombosis.  Ultrasound reveals potential chronic thrombus versus fibrin sheath versus history of long-term venous access, though acute clot cannot be fully excluded.  Patient does state at one point in time during her treatment she did have a line on the left side, which could be the explanation for this.  However in the setting of cancer and therefore potential procoagulable state, will obtain coags and order heparin, will discuss this plan with hospitalist.  Will admit.  Updated patient on results and plan of care, she voices understanding and is agreeable.   Final Clinical Impression(s) / ED Diagnosis  Final diagnoses:  Epigastric abdominal pain       Note:  This document was prepared using Dragon voice recognition software and may  include unintentional dictation errors.   Lilia Pro., MD 02/08/19 Roderic Palau    Lilia Pro., MD 02/16/19 [id number]

## 2019-02-07 NOTE — Progress Notes (Signed)
ANTICOAGULATION CONSULT NOTE   Pharmacy Consult for Enoxaparin  Indication: VTE treatment  No Known Allergies  Patient Measurements: Height: 5\' 6"  (167.6 cm) Weight: 189 lb (85.7 kg) IBW/kg (Calculated) : 59.3 HEPARIN DW (KG): 77.6  Vital Signs: BP: 167/89 (11/26 0630) Pulse Rate: 102 (11/26 0630)  Labs: Recent Labs    02/06/19 1919 02/07/19 0643  HGB 10.3*  --   HCT 32.2*  --   PLT 260  --   APTT  --  47*  LABPROT  --  14.4  INR  --  1.1  CREATININE 1.07*  --   TROPONINIHS 9 10   Estimated Creatinine Clearance: 47 mL/min (A) (by C-G formula based on SCr of 1.07 mg/dL (H)).  Medical History: Past Medical History:  Diagnosis Date  . Cancer ([place])   . Hypertension     Medications:  Heparin (bolus+indusion) Pt not on anticoagulants PTA per med list.  Assessment: Pharmacy asked to initiate and monitor Enoxaparin for VTE treatment. Patient was recently started on heparin (bolus+ infusion). Confirmed with Dr. Reesa Chew, that the patient will be on enoxaparin and discontinue heparin.    Plan:  1. Discontinue heparin infusion- called and informed ED nurse @ ~ 0800.   2. Enoxaparin 1 mg/kg Q12H - will start at 1000 today.  Rowland Lathe 02/07/2019,7:55 AM

## 2019-02-07 NOTE — Progress Notes (Signed)
ANTICOAGULATION CONSULT NOTE - Initial Consult  Pharmacy Consult for Heparin Indication: VTE treatment  No Known Allergies  Patient Measurements: Height: 5\' 6"  (167.6 cm) Weight: 189 lb (85.7 kg) IBW/kg (Calculated) : 59.3 HEPARIN DW (KG): 77.6  Vital Signs: Temp: 98.6 F (37 C) (11/25 1854) Temp Source: Oral (11/25 1854) BP: 163/89 (11/26 0535) Pulse Rate: 102 (11/26 0535)  Labs: Recent Labs    02/06/19 1919  HGB 10.3*  HCT 32.2*  PLT 260  CREATININE 1.07*  TROPONINIHS 9   Estimated Creatinine Clearance: 47 mL/min (A) (by C-G formula based on SCr of 1.07 mg/dL (H)).  Medical History: Past Medical History:  Diagnosis Date  . Cancer ([place])   . Hypertension     Medications:  (Not in a hospital admission)   Assessment: Pharmacy asked to initiate and monitor Heparin for VTE treatment.  Baseline labs ordered.  Pt not on anticoagulants PTA per med list.  Goal of Therapy:  Heparin level 0.3-0.7 units/ml Monitor platelets by anticoagulation protocol: Yes   Plan:  Heparin 5000 units bolus x 1 then infusion at 1300 units/hr Check HL 8 hours after infusion started  Hart Robinsons A 02/07/2019,6:22 AM

## 2019-02-07 NOTE — Progress Notes (Signed)
*  PRELIMINARY RESULTS* Echocardiogram 2D Echocardiogram has been performed.  Sherrie Sport 02/07/2019, 12:52 PM

## 2019-02-08 DIAGNOSIS — R000 Tachycardia, unspecified: Secondary | ICD-10-CM

## 2019-02-08 LAB — CBC
HCT: 26.4 % — ABNORMAL LOW (ref 36.0–46.0)
Hemoglobin: 8.7 g/dL — ABNORMAL LOW (ref 12.0–15.0)
MCH: 27.6 pg (ref 26.0–34.0)
MCHC: 33 g/dL (ref 30.0–36.0)
MCV: 83.8 fL (ref 80.0–100.0)
Platelets: 221 10*3/uL (ref 150–400)
RBC: 3.15 MIL/uL — ABNORMAL LOW (ref 3.87–5.11)
RDW: 15.2 % (ref 11.5–15.5)
WBC: 5.5 10*3/uL (ref 4.0–10.5)
nRBC: 0 % (ref 0.0–0.2)

## 2019-02-08 LAB — MAGNESIUM: Magnesium: 1.8 mg/dL (ref 1.7–2.4)

## 2019-02-08 LAB — COMPREHENSIVE METABOLIC PANEL
ALT: 8 U/L (ref 0–44)
AST: 14 U/L — ABNORMAL LOW (ref 15–41)
Albumin: 3.1 g/dL — ABNORMAL LOW (ref 3.5–5.0)
Alkaline Phosphatase: 68 U/L (ref 38–126)
Anion gap: 12 (ref 5–15)
BUN: 26 mg/dL — ABNORMAL HIGH (ref 8–23)
CO2: 21 mmol/L — ABNORMAL LOW (ref 22–32)
Calcium: 9.2 mg/dL (ref 8.9–10.3)
Chloride: 107 mmol/L (ref 98–111)
Creatinine, Ser: 1.12 mg/dL — ABNORMAL HIGH (ref 0.44–1.00)
GFR calc Af Amer: 54 mL/min — ABNORMAL LOW (ref >60)
GFR calc non Af Amer: 47 mL/min — ABNORMAL LOW (ref >60)
Glucose, Bld: 85 mg/dL (ref 70–99)
Potassium: 3.3 mmol/L — ABNORMAL LOW (ref 3.5–5.1)
Sodium: 140 mmol/L (ref 135–145)
Total Bilirubin: 0.8 mg/dL (ref 0.3–1.2)
Total Protein: 7.7 g/dL (ref 6.5–8.1)

## 2019-02-08 LAB — SARS CORONAVIRUS 2 (TAT 6-24 HRS): SARS Coronavirus 2: NEGATIVE

## 2019-02-08 LAB — GLUCOSE, CAPILLARY
Glucose-Capillary: 73 mg/dL (ref 70–99)
Glucose-Capillary: 82 mg/dL (ref 70–99)
Glucose-Capillary: 80 mg/dL (ref 70–99)
Glucose-Capillary: 122 mg/dL — ABNORMAL HIGH (ref 70–99)

## 2019-02-08 LAB — PROTIME-INR
INR: 1.1 (ref 0.8–1.2)
Prothrombin Time: 14.5 seconds (ref 11.4–15.2)

## 2019-02-08 MED ORDER — APIXABAN 5 MG PO TABS: ORAL_TABLET | ORAL | 1 refills | Status: AC

## 2019-02-08 NOTE — Discharge Summary (Signed)
Physician Discharge Summary  Veronica Grimes [id number] DOB: 10-16-1940 DOA: 02/07/2019  PCP: Patient, No Pcp Per  Admit date: 02/07/2019 Discharge date: 02/08/2019  Admitted From: Observation Disposition: home  Recommendations for Outpatient Follow-up:  1. Follow up with PCP in 1-2 weeks   Home Health:No Equipment/Devices:no new equipment  Discharge Condition:Stable CODE STATUS:Full code Diet recommendation: Resume preadmission diet  Brief/Interim Summary: Per admitting physician:  Veronica Grimes is a 78 y.o. female with medical history significant of lung cancer status post lobectomy/chemo, essential hypertension was brought to the hospital for evaluation of syncope.  Patient is visiting out of town and is living in a motel where her family thought she passed out and called EMS.  Apparently just before this she had reported some epigastric pain after eating some Popeye.  No other complaints.  In the ER her basic lab work showed hypokalemia.  CTA of the chest and carotid Dopplers were positive for left subclavian vein thrombosis.  It also showed previous lobectomy with spiculated mass.  She was started on anticoagulation and medical team was requested to admit the patient.  When I saw her she was comfortable and did not have any new complaints.  Her nausea  and abdominal discomfort have resolved.  Previously her lung cancer was treated in [place] at least 5 years ago, she had also underwent lobectomy.  According to her she has been in remission  Hospital course: Left subclavian vein thrombosis with a history of lung cancer and CTA chest concerning for spiculated mass.  Patient was initially placed on Lovenox, which he tolerated complication.  She is converted over to p.o. Eliquis with starter pack given of 10 mg p.o. twice daily x7 days and 5 mg p.o. twice daily thereafter.  Patient will follow closely with her doctors in [place].  Patient reports that that is where she is  currently being treated.  Also discussed with the patient his CTA findings concerning for spiculated mass on lung concerning because of prior history of lung cancer with treatment.  Patient is aware and understands importance of close follow-up upon returning to [facility].  Patient did not want to remain in the hospital for further work-up or evaluation while here in [place].  While in the hospital patient's low potassium was treated, she was also noted to have sinus tachycardia which is consistent with subsequent findings as well as what appears to be some mild anxiety.  Patient will continue home medication on discharge.  Report of syncope which is likely vasovagal.  Echocardiogram was normal patient had no focal neurological findings, she was gently hydrated without further complications.  Patient felt at her baseline, requested to be discharged home so she can follow-up with her doctors in [place].   Discharge Diagnoses:  Principal Problem:   Subclavian vein thrombosis (HCC) Active Problems:   Lung mass   CAD (coronary artery disease)   Hilar lymphadenopathy   Sinus tachycardia   Benign essential HTN   Hypokalemia   Syncope    Discharge Instructions  Discharge Instructions    Call MD for:  difficulty breathing, headache or visual disturbances   Complete by: As directed    Call MD for:  extreme fatigue   Complete by: As directed    Call MD for:  hives   Complete by: As directed    Call MD for:  persistant dizziness or light-headedness   Complete by: As directed    Call MD for:  persistant nausea and vomiting   Complete by:  As directed    Call MD for:  severe uncontrolled pain   Complete by: As directed    Call MD for:  temperature >100.4   Complete by: As directed    Diet - low sodium heart healthy   Complete by: As directed    Increase activity slowly   Complete by: As directed      Allergies as of 02/08/2019   No Known Allergies      Medication List    TAKE these medications   apixaban 5 MG Tabs tablet Commonly known as: Eliquis Take 2 tablets (10mg ) twice daily for 7 days, then 1 tablet (5mg ) twice daily   benazepril 20 MG tablet Commonly known as: LOTENSIN Take 20 mg by mouth daily.   hydrochlorothiazide 12.5 MG tablet Commonly known as: HYDRODIURIL Take 12.5 mg by mouth daily.   omeprazole 20 MG capsule Commonly known as: PRILOSEC Take 20 mg by mouth daily.   promethazine 25 MG tablet Commonly known as: PHENERGAN Take 25 mg by mouth every 4 (four) hours as needed.   sucralfate 1 g tablet Commonly known as: CARAFATE Take 1 g by mouth 4 (four) times daily.       No Known Allergies  Consultations:  None   Procedures/Studies: Dg Chest 2 View  Result Date: 02/07/2019 CLINICAL DATA:  Chest pain, history of lung cancer EXAM: CHEST - 2 VIEW COMPARISON:  None. FINDINGS: There are postsurgical changes in the right lung with more masslike areas of opacity in the right upper lobe and left mid lung. There is some more patchy airspace opacity present as well. There is a loculated right pleural effusion tracking within the fissures and with resulting apical capping. Mild cardiomegaly. No pneumothorax. Cholecystectomy clips in the right upper quadrant. No acute osseous or soft tissue abnormality. Degenerative changes are present in the imaged spine and shoulders. IMPRESSION: 1. Postoperative changes in the right lung likely from prior lobectomy are wedge resection. Correlate with surgical history. 2. Masslike areas of opacity in the right upper lobe and left mid lung worrisome for malignancy given patient's history of lung cancer. Additional ill-defined areas of opacity could suggest infection as well. 3. Loculated right pleural effusion tracking within the fissures and resulting apical capping. Electronically Signed   By: Lovena Le M.D.   On: 02/07/2019 02:28   Ct Angio Chest Pe W/cm &/or Wo Cm  Result Date:  02/07/2019 CLINICAL DATA:  Rule out PE, abnormal chest CT EXAM: CT ANGIOGRAPHY CHEST WITH CONTRAST TECHNIQUE: Multidetector CT imaging of the chest was performed using the standard protocol during bolus administration of intravenous contrast. Multiplanar CT image reconstructions and MIPs were obtained to evaluate the vascular anatomy. CONTRAST:  47mL OMNIPAQUE IOHEXOL 350 MG/ML SOLN COMPARISON:  Same-day radiograph FINDINGS: Cardiovascular: Satisfactory opacification of the pulmonary arteries to the segmental level. No pulmonary arterial filling defects are identified. There is central pulmonary artery enlargement but without elevation of the RV/LV ratio (0.8). Mild cardiomegaly. Coronary artery calcifications are noted. Small volume of pericardial fluid. Calcifications on the aortic leaflets. Normal caliber thoracic aorta. Atherosclerotic calcification present throughout the thoracic aorta and normally branching three-vessel arch. Note is made of extensive venous reflux of contrast in the mediastinum. Injection of contrast is from the left upper extremity. There is a smooth linear filling defect in the region of the left subclavian vein worrisome for venous thrombosis. No other filling defects are identified. Mediastinum/Nodes: There is extensive mediastinal and hilar adenopathy including a large conglomerate subcarinal nodal mass  measuring up to 2.8 cm (4/42) and additional right paratracheal lymph node measuring up to 11 mm (4/19) and some associated hilar nodal masses. There are postsurgical changes with truncation right upper lobe pulmonary artery and vein suggesting prior right upper lobectomy. Extensive confluent, spiculated soft tissue attenuation extends from the resection margin into the lung tissue measuring approximately 4.3 x 3.9 cm in size. Lungs/Pleura: There are multifocal masslike opacities throughout the lungs including a partially cavitary lesion in the superior segment left lower lobe measuring  up to 1.7 cm in size (4/52) largest lesion is a bilobed subpleural mass in the superior segment right lower lobe measuring up to 4.9 cm in maximal diameter. Additional masses are seen throughout the remaining lobes of the lung as well as along the mediastinal border in the left upper lobe (4/24). No convincing features of superimposed infection at this time. Upper Abdomen: Patient is post cholecystectomy. Few fluid attenuation cystic lesions are present in the right kidney. No acute abnormalities present in the visualized portions of the upper abdomen. Musculoskeletal: The osseous structures appear diffusely demineralized which may limit detection of small or nondisplaced fractures. Multilevel degenerative changes are present in the imaged portions of the spine. Deformity of the right posterior fifth and sixth ribs may be related to prior thoracotomy. Review of the MIP images confirms the above findings. IMPRESSION: 1. No evidence of pulmonary artery emboli. 2. Linear filling defect seen in the left subclavian vein potentially extending from the left internal jugular vein. Findings worrisome for venous thrombosis though a laminar flow artifact could present with a similar appearance. Consider further evaluation with left upper extremity venous Doppler 3. Evidence of what appears to be a prior right upper lobectomy with extensive spiculated soft tissue attenuation along the resection margin concerning for locally recurrent disease. Numerous additional spiculated masses and nodules throughout the remaining portions of the lungs are compatible with metastatic disease. Additional mediastinal hilar adenopathy is noted as well. 4. Aortic atherosclerosis. Coronary artery disease. 5. Deformity of the right posterior fifth and sixth ribs may be related to prior thoracotomy. Aortic Atherosclerosis (ICD10-I70.0). These results were called by telephone at the time of interpretation on 02/07/2019 at 3:57 am to provider [facility] , who verbally acknowledged these results. Electronically Signed   By: Lovena Le M.D.   On: 02/07/2019 03:58   US Venous Img Upper Left (dvt Study)  Result Date: 02/07/2019 CLINICAL DATA:  Possible DVT on CT EXAM: LEFT UPPER EXTREMITY VENOUS DOPPLER ULTRASOUND TECHNIQUE: Gray-scale sonography with graded compression, as well as color Doppler and duplex ultrasound were performed to evaluate the upper extremity deep venous system from the level of the subclavian vein and including the jugular, axillary, basilic, radial, ulnar and upper cephalic vein. Spectral Doppler was utilized to evaluate flow at rest and with distal augmentation maneuvers. COMPARISON:  CT angiography 02/07/2019 FINDINGS: Contralateral Subclavian Vein: Respiratory phasicity is normal and symmetric with the symptomatic side. No evidence of thrombus. Normal compressibility. Internal Jugular Vein: No evidence of thrombus. Normal compressibility, respiratory phasicity and response to augmentation. Subclavian Vein: There is some echogenic, nonocclusive thrombus in the left subclavian vein. Extension to the IJ is not visualized on this sonographic exam. Normal respiratory phasicity. Axillary Vein: No evidence of thrombus. Normal compressibility, respiratory phasicity and response to augmentation. Cephalic Vein: No evidence of thrombus. Normal compressibility, respiratory phasicity and response to augmentation. Basilic Vein: No evidence of thrombus. Normal compressibility, respiratory phasicity and response to augmentation. Brachial Veins: No evidence of thrombus.  Normal compressibility, respiratory phasicity and response to augmentation. Radial Veins: No evidence of thrombus. Normal compressibility, respiratory phasicity and response to augmentation. Ulnar Veins: No evidence of thrombus. Normal compressibility, respiratory phasicity and response to augmentation. Venous Reflux:  None visualized. Other Findings:  None visualized. IMPRESSION:  Echogenic nonocclusive thrombus is seen at the left subclavian vein. Increased echogenicity in linear configuration may reflect a more chronic thrombus or potential fibrin sheath patient has had a history long-term venous access though acute clot cannot be fully excluded. These results were called by telephone at the time of interpretation on 02/07/2019 at 5:55 am to provider [facility] , who verbally acknowledged these results. Electronically Signed   By: Lovena Le M.D.   On: 02/07/2019 05:57       Subjective: Patient reports she felt that her baseline.  She requested to be discharged home.  Discharge Exam: Vitals:   02/07/19 1941 02/08/19 0425  BP: 132/72 (!) 158/77  Pulse: 93 88  Resp: 20 20  Temp: 98.2 F (36.8 C) 98.7 F (37.1 C)  SpO2: 100% 98%   Vitals:   02/07/19 1402 02/07/19 1941 02/08/19 0425 02/08/19 0426  BP: 126/70 132/72 (!) 158/77   Pulse: (!) 101 93 88   Resp: 18 20 20    Temp: 98.9 F (37.2 C) 98.2 F (36.8 C) 98.7 F (37.1 C)   TempSrc: Oral Oral Oral   SpO2: 100% 100% 98%   Weight:    77.3 kg  Height:        General: Pt is alert, awake, not in acute distress Cardiovascular: RRR, S1/S2 +, no rubs, no gallops Respiratory: CTA bilaterally, no wheezing, no rhonchi Abdominal: Soft, NT, ND, bowel sounds + Extremities: no edema, no cyanosis    The results of significant diagnostics from this hospitalization (including imaging, microbiology, ancillary and laboratory) are listed below for reference.     Microbiology: Recent Results (from the past 240 hour(s))  SARS CORONAVIRUS 2 (TAT 6-24 HRS) Nasopharyngeal Nasopharyngeal Swab     Status: None   Collection Time: 02/07/19  6:43 AM   Specimen: Nasopharyngeal Swab  Result Value Ref Range Status   SARS Coronavirus 2 NEGATIVE NEGATIVE Final    Comment: (NOTE) SARS-CoV-2 target nucleic acids are NOT DETECTED. The SARS-CoV-2 RNA is generally detectable in upper and lower respiratory specimens during the  acute phase of infection. Negative results do not preclude SARS-CoV-2 infection, do not rule out co-infections with other pathogens, and should not be used as the sole basis for treatment or other patient management decisions. Negative results must be combined with clinical observations, patient history, and epidemiological information. The expected result is Negative. Fact Sheet for Patients: SugarRoll.be Fact Sheet for Healthcare Providers: https://www.woods-mathews.com/ This test is not yet approved or cleared by the [place] FDA and  has been authorized for detection and/or diagnosis of SARS-CoV-2 by FDA under an Emergency Use Authorization (EUA). This EUA will remain  in effect (meaning this test can be used) for the duration of the COVID-19 declaration under Section 56 4(b)(1) of the Act, 21 U.S.C. section 360bbb-3(b)(1), unless the authorization is terminated or revoked sooner. Performed at [facility], [place] [address], [place], [place] [address]      Labs: BNP (last 3 results) No results for input(s): BNP in the last 8760 hours. Basic Metabolic Panel: Recent Labs  Lab 02/06/19 1919 02/08/19 0427  NA 139 140  K 3.2* 3.3*  CL 102 107  CO2 24 21*  GLUCOSE 126*  85  BUN 18 26*  CREATININE 1.07* 1.12*  CALCIUM 9.7 9.2  MG  --  1.8   Liver Function Tests: Recent Labs  Lab 02/06/19 1919 02/08/19 0427  AST 17 14*  ALT 9 8  ALKPHOS 85 68  BILITOT 0.6 0.8  PROT 9.0* 7.7  ALBUMIN 3.6 3.1*   Recent Labs  Lab 02/06/19 1919  LIPASE 26   No results for input(s): AMMONIA in the last 168 hours. CBC: Recent Labs  Lab 02/06/19 1919 02/08/19 0427  WBC 8.9 5.5  HGB 10.3* 8.7*  HCT 32.2* 26.4*  MCV 86.8 83.8  PLT 260 221   Cardiac Enzymes: No results for input(s): CKTOTAL, CKMB, CKMBINDEX, TROPONINI in the last 168 hours. BNP: Invalid input(s): POCBNP CBG: Recent Labs  Lab 02/07/19 1130 02/07/19 1653  02/07/19 2110 02/08/19 0750  GLUCAP 100* 110* 88 73   D-Dimer No results for input(s): DDIMER in the last 72 hours. Hgb A1c Recent Labs    02/07/19 1124  HGBA1C 5.8*   Lipid Profile No results for input(s): CHOL, HDL, LDLCALC, TRIG, CHOLHDL, LDLDIRECT in the last 72 hours. Thyroid function studies Recent Labs    02/07/19 0915  TSH 0.860   Anemia work up No results for input(s): VITAMINB12, FOLATE, FERRITIN, TIBC, IRON, RETICCTPCT in the last 72 hours. Urinalysis No results found for: COLORURINE, APPEARANCEUR, [place], [place], [place], [place], [place], [place], PROTEINUR, UROBILINOGEN, NITRITE, LEUKOCYTESUR Sepsis Labs Invalid input(s): PROCALCITONIN,  WBC,  LACTICIDVEN Microbiology Recent Results (from the past 240 hour(s))  SARS CORONAVIRUS 2 (TAT 6-24 HRS) Nasopharyngeal Nasopharyngeal Swab     Status: None   Collection Time: 02/07/19  6:43 AM   Specimen: Nasopharyngeal Swab  Result Value Ref Range Status   SARS Coronavirus 2 NEGATIVE NEGATIVE Final    Comment: (NOTE) SARS-CoV-2 target nucleic acids are NOT DETECTED. The SARS-CoV-2 RNA is generally detectable in upper and lower respiratory specimens during the acute phase of infection. Negative results do not preclude SARS-CoV-2 infection, do not rule out co-infections with other pathogens, and should not be used as the sole basis for treatment or other patient management decisions. Negative results must be combined with clinical observations, patient history, and epidemiological information. The expected result is Negative. Fact Sheet for Patients: SugarRoll.be Fact Sheet for Healthcare Providers: https://www.woods-mathews.com/ This test is not yet approved or cleared by the [place] FDA and  has been authorized for detection and/or diagnosis of SARS-CoV-2 by FDA under an Emergency Use Authorization (EUA). This EUA will remain  in effect (meaning this test can  be used) for the duration of the COVID-19 declaration under Section 56 4(b)(1) of the Act, 21 U.S.C. section 360bbb-3(b)(1), unless the authorization is terminated or revoked sooner. Performed at [facility], [place] [address], [place], [place] [address]      Time coordinating discharge: Over 30 minutes  SIGNED:   Nicolette Bang, MD  [facility] 02/08/2019, 10:40 AM Pager   If 7PM-7AM, please contact night-coverage [email or web address] Password [email or web address]

## 2019-02-08 NOTE — Progress Notes (Signed)
Entered the room to discharge patient. Patient was sitting up in chair. Patient stated she is nauseous, head hurts and just doesn't feel well. Per Dr. Wyonia Hough will keep patient tonight and continue to monitor.    Fuller Mandril, RN

## 2019-02-08 NOTE — Evaluation (Signed)
Occupational Therapy Evaluation Patient Details Name: Veronica Grimes MRN: [id number] DOB: 10-09-1940 Today's Date: 02/08/2019    History of Present Illness Pt is a 78 y.o. female presenting to hospital with epigastric pain, nausea, and syncope 11/26.  Pt found to have echogenic nonocclusive thrombus L subclavian vein, numerus spiculated masses in lungs, and hypokalemia.  PMH includes lung CA s/p resection (and s/p chemo/rad), htn, gastic ulcer.   Clinical Impression   Patient seen for OT evaluation this date.  Patient lives at home with her daughter and grandchildren in [place], [place].  She was visiting the area for the holiday to visit with her cousin.  She reports she was at the motel and passed out, her family called EMS and she was admitted to the hospital.  She is able to complete her basic self care tasks with modified independence in the acute setting.  She reports she still feels tired and has a decreased tolerance for activity but as long as she paces herself and goes slow, she is able to complete tasks.  She was able to ambulate to the bathroom for toileting with modified independence and also completed grooming at the sink.  She does not appear to have any OT needs currently and plans to return home with her family.      Follow Up Recommendations  No OT follow up    Equipment Recommendations       Recommendations for Other Services       Precautions / Restrictions Restrictions Weight Bearing Restrictions: No      Mobility Bed Mobility Overal bed mobility: Independent             General bed mobility comments: Pt reports no difficulties with bed mobility (deferred)  Transfers Overall transfer level: Independent Equipment used: None             General transfer comment: steady safe transfers noted    Balance Overall balance assessment: Independent                                         ADL either performed or assessed with clinical  judgement   ADL Overall ADL's : At baseline                                       General ADL Comments: Patient able to demonstrate toileting, grooming at the sink, dressing (simulated) with modified independence.  She reports she normally helps with some light cleaning at home, making the bed, helping with laundry but doesn't do any cooking.     Vision Baseline Vision/History: Wears glasses Wears Glasses: At all times Additional Comments: Patient does not have her glasses here but reports she wears them all the time at home.     Perception     Praxis      Pertinent Vitals/Pain Pain Assessment: No/denies pain     Hand Dominance Right   Extremity/Trunk Assessment Upper Extremity Assessment Upper Extremity Assessment: Overall WFL for tasks assessed   Lower Extremity Assessment Lower Extremity Assessment: Defer to PT evaluation   Cervical / Trunk Assessment Cervical / Trunk Assessment: Normal   Communication Communication Communication: HOH   Cognition Arousal/Alertness: Awake/alert Behavior During Therapy: WFL for tasks assessed/performed Overall Cognitive Status: Within Functional Limits for tasks assessed  General Comments       Exercises     Shoulder Instructions      Home Living Family/patient expects to be discharged to:: Private residence Living Arrangements: Children Available Help at Discharge: Family Type of Home: House Home Access: Stairs to enter Technical brewer of Steps: 1 Entrance Stairs-Rails: None Home Layout: One level     Bathroom Shower/Tub: Walk-in shower;Curtain   Bathroom Toilet: Standard(has BSC over toilet)     Home Equipment: Environmental consultant - 2 wheels          Prior Functioning/Environment Level of Independence: Independent                 OT Problem List: Decreased activity tolerance      OT Treatment/Interventions:      OT Goals(Current goals  can be found in the care plan section) Acute Rehab OT Goals Patient Stated Goal: To go back home to [place] OT Goal Formulation: With patient Time For Goal Achievement: 02/08/19  OT Frequency:     Barriers to D/C:            Co-evaluation              AM-PAC OT "6 Clicks" Daily Activity     Outcome Measure Help from another person eating meals?: None Help from another person taking care of personal grooming?: None Help from another person toileting, which includes using toliet, bedpan, or urinal?: None Help from another person bathing (including washing, rinsing, drying)?: None Help from another person to put on and taking off regular upper body clothing?: None Help from another person to put on and taking off regular lower body clothing?: None 6 Click Score: 24   End of Session Equipment Utilized During Treatment: Gait belt  Activity Tolerance: Patient tolerated treatment well Patient left: in chair  OT Visit Diagnosis: Muscle weakness (generalized) (M62.81)                Time: [id number] OT Time Calculation (min): 21 min Charges:  OT General Charges $OT Visit: 1 Visit OT Evaluation $OT Eval Low Complexity: 1 Low  Amy T Lovett, OTR/L, CLT   Lovett,Amy 02/08/2019, 1:42 PM

## 2019-02-08 NOTE — Care Management Obs Status (Signed)
[facility] NOTIFICATION   Patient Details  Name: Shiquita Householder MRN: [id number] Date of Birth: 18-Sep-1940   Medicare Observation Status Notification Given:  Yes    Beverly Sessions, RN 02/08/2019, 10:59 AM

## 2019-02-08 NOTE — Evaluation (Signed)
Physical Therapy Evaluation Patient Details Name: Veronica Grimes MRN: [id number] DOB: 06-Nov-1940 Today's Date: 02/08/2019   History of Present Illness  Pt is a 78 y.o. female presenting to hospital with epigastric pain, nausea, and syncope 11/26.  Pt found to have echogenic nonocclusive thrombus L subclavian vein, numerus spiculated masses in lungs, and hypokalemia.  PMH includes lung CA s/p resection (and s/p chemo/rad), htn, gastic ulcer.  Clinical Impression  Prior to hospital admission, pt was independent.  Pt lives in [place] and is currently visiting family and staying at a motel (no steps at [employer]).  Currently pt is independent with transfers and ambulation 120 feet in room; limited ambulation d/t pt reporting nausea.  Pt steady without any loss of balance during sessions activities; pt reporting no pain during session.  No acute PT needs identified; will discharge pt from PT in house.  Pt encouraged to ambulate with nursing staff during hospitalization to prevent deconditioning.    Follow Up Recommendations No PT follow up    Equipment Recommendations  None recommended by PT    Recommendations for Other Services       Precautions / Restrictions Restrictions Weight Bearing Restrictions: No      Mobility  Bed Mobility               General bed mobility comments: Pt reports no difficulties with bed mobility (deferred)  Transfers Overall transfer level: Independent Equipment used: None             General transfer comment: steady safe transfers noted  Ambulation/Gait Ambulation/Gait assistance: Independent Gait Distance (Feet): 120 Feet(in hospital room) Assistive device: None Gait Pattern/deviations: WFL(Within Functional Limits) Gait velocity: mildly decreased   General Gait Details: steady safe gait noted  Stairs            Wheelchair Mobility    Modified Rankin (Stroke Patients Only)       Balance Overall balance  assessment: No apparent balance deficits (not formally assessed)(no loss of balance with functional mobility/ambulation during session)                                           Pertinent Vitals/Pain Pain Assessment: No/denies pain  Vitals (HR and O2 on room air) stable and WFL throughout treatment session.    Home Living Family/patient expects to be discharged to:: Private residence(currently staying at [facility] (visiting from [facility]))   Available Help at Discharge: Family Type of Home: House Home Access: Stairs to enter Entrance Stairs-Rails: None Entrance Stairs-Number of Steps: 1 Home Layout: One level Home Equipment: Environmental consultant - 2 wheels      Prior Function Level of Independence: Independent               Hand Dominance        Extremity/Trunk Assessment   Upper Extremity Assessment Upper Extremity Assessment: Overall WFL for tasks assessed    Lower Extremity Assessment Lower Extremity Assessment: Overall WFL for tasks assessed    Cervical / Trunk Assessment Cervical / Trunk Assessment: Normal  Communication   Communication: HOH  Cognition Arousal/Alertness: Awake/alert Behavior During Therapy: WFL for tasks assessed/performed Overall Cognitive Status: Within Functional Limits for tasks assessed  General Comments   Nursing cleared pt for participation in physical therapy.  Pt agreeable to PT session.    Exercises     Assessment/Plan    PT Assessment Patent does not need any further PT services  PT Problem List         PT Treatment Interventions      PT Goals (Current goals can be found in the Care Plan section)  Acute Rehab PT Goals Patient Stated Goal: to discharge and be with family PT Goal Formulation: With patient Time For Goal Achievement: 02/22/19 Potential to Achieve Goals: Good    Frequency     Barriers to discharge        Co-evaluation                AM-PAC PT "6 Clicks" Mobility  Outcome Measure Help needed turning from your back to your side while in a flat bed without using bedrails?: None Help needed moving from lying on your back to sitting on the side of a flat bed without using bedrails?: None Help needed moving to and from a bed to a chair (including a wheelchair)?: None Help needed standing up from a chair using your arms (e.g., wheelchair or bedside chair)?: None Help needed to walk in hospital room?: None Help needed climbing 3-5 steps with a railing? : None 6 Click Score: 24    End of Session Equipment Utilized During Treatment: Gait belt Activity Tolerance: Patient tolerated treatment well Patient left: in chair;with call bell/phone within reach Nurse Communication: Mobility status;Precautions(unable to get a hold of pt's nurse via phone (x2 attempts) but notified NT (and also that [place] removed and pt did not appear to require purewick)) PT Visit Diagnosis: Difficulty in walking, not elsewhere classified (R26.2)    Time: [id number] PT Time Calculation (min) (ACUTE ONLY): 15 min   Charges:   PT Evaluation $PT Eval Low Complexity: 1 Low          Kaida Games, PT 02/08/19, 11:41 AM [phone]

## 2019-02-09 DIAGNOSIS — R590 Localized enlarged lymph nodes: Secondary | ICD-10-CM | POA: Diagnosis not present

## 2019-02-09 DIAGNOSIS — I82B12 Acute embolism and thrombosis of left subclavian vein: Secondary | ICD-10-CM

## 2019-02-09 DIAGNOSIS — R55 Syncope and collapse: Secondary | ICD-10-CM

## 2019-02-09 DIAGNOSIS — I10 Essential (primary) hypertension: Secondary | ICD-10-CM | POA: Diagnosis not present

## 2019-02-09 DIAGNOSIS — I2510 Atherosclerotic heart disease of native coronary artery without angina pectoris: Secondary | ICD-10-CM

## 2019-02-09 DIAGNOSIS — R000 Tachycardia, unspecified: Secondary | ICD-10-CM

## 2019-02-09 DIAGNOSIS — E876 Hypokalemia: Secondary | ICD-10-CM

## 2019-02-09 DIAGNOSIS — R918 Other nonspecific abnormal finding of lung field: Secondary | ICD-10-CM

## 2019-02-09 LAB — CBC
HCT: 27.4 % — ABNORMAL LOW (ref 36.0–46.0)
Hemoglobin: 9 g/dL — ABNORMAL LOW (ref 12.0–15.0)
MCH: 27.3 pg (ref 26.0–34.0)
MCHC: 32.8 g/dL (ref 30.0–36.0)
MCV: 83 fL (ref 80.0–100.0)
Platelets: 229 10*3/uL (ref 150–400)
RBC: 3.3 MIL/uL — ABNORMAL LOW (ref 3.87–5.11)
RDW: 14.9 % (ref 11.5–15.5)
WBC: 4.5 10*3/uL (ref 4.0–10.5)
nRBC: 0 % (ref 0.0–0.2)

## 2019-02-09 LAB — GLUCOSE, CAPILLARY
Glucose-Capillary: 77 mg/dL (ref 70–99)
Glucose-Capillary: 92 mg/dL (ref 70–99)
Glucose-Capillary: 91 mg/dL (ref 70–99)

## 2019-02-09 LAB — MAGNESIUM: Magnesium: 1.6 mg/dL — ABNORMAL LOW (ref 1.7–2.4)

## 2019-02-09 MED ORDER — MAGNESIUM SULFATE IN D5W 1-5 GM/100ML-% IV SOLN
1.0000 g | Freq: Once | INTRAVENOUS | Status: AC
  Administered 2019-02-09: 1 g via INTRAVENOUS
  Filled 2019-02-09: qty 100

## 2019-02-09 MED ORDER — ENOXAPARIN SODIUM 80 MG/0.8ML ~~LOC~~ SOLN
1.0000 mg/kg | Freq: Two times a day (BID) | SUBCUTANEOUS | Status: DC
  Filled 2019-02-09: qty 0.8

## 2019-02-09 MED ORDER — MAGNESIUM SULFATE 50 % IJ SOLN: 1.0000 g | Freq: Once | INTRAMUSCULAR | Status: DC

## 2019-02-09 NOTE — Progress Notes (Signed)
Discharge instructions reviewed with patient including followup visits and new medications.  Understanding was verbalized and all questions were answered.  IVs removed without complication; patient tolerated well.  Patient discharged home via wheelchair in stable condition escorted by nursing staff.

## 2019-02-09 NOTE — Progress Notes (Signed)
ANTICOAGULATION CONSULT NOTE   Pharmacy Consult for Enoxaparin  Indication: VTE treatment  No Known Allergies  Patient Measurements: Height: 5\' 6"  (167.6 cm) Weight: 167 lb 8.8 oz (76 kg) IBW/kg (Calculated) : 59.3 HEPARIN DW (KG): 77.6  Vital Signs: Temp: 99.3 F (37.4 C) (11/28 1238) Temp Source: Oral (11/28 1238) BP: 158/78 (11/28 1238) Pulse Rate: 88 (11/28 1238)  Labs: Recent Labs    02/06/19 1919 02/07/19 0643 02/08/19 0427 02/09/19 0429  HGB 10.3*  --  8.7* 9.0*  HCT 32.2*  --  26.4* 27.4*  PLT 260  --  221 229  APTT  --  47*  --   --   LABPROT  --  14.4 14.5  --   INR  --  1.1 1.1  --   CREATININE 1.07*  --  1.12*  --   TROPONINIHS 9 10  --   --    Estimated Creatinine Clearance: 42.4 mL/min (A) (by C-G formula based on SCr of 1.12 mg/dL (H)).  Medical History: Past Medical History:  Diagnosis Date  . Cancer ([place])   . Hypertension     Medications:  Heparin (bolus+indusion) Pt not on anticoagulants PTA per med list.  Assessment: Pharmacy asked to initiate and monitor Enoxaparin for VTE treatment. Patient was recently started on heparin (bolus+ infusion). Confirmed with Dr. Reesa Chew, that the patient will be on enoxaparin and discontinue heparin.    Plan:   Enoxaparin 1 mg/kg Q12H .  Weight chg. Dose now 75 q12h. To d/c on apixaban  Nelida Mandarino A 02/09/2019,2:47 PM

## 2019-02-09 NOTE — Discharge Summary (Signed)
Physician Discharge Summary  Veronica Grimes [id number] DOB: 1940/08/28 DOA: 02/07/2019  PCP: Veronica Grimes, No Pcp Per  Admit date: 02/07/2019 Discharge date: 02/09/2019  Admitted From: Observation Disposition: home  Recommendations for Outpatient Follow-up:  1. Follow up with PCP in 1-2 weeks   Home Health:No Equipment/Devices:no new equipment  Discharge Condition:Stable CODE STATUS:Full code Diet recommendation: resume preadmission diet  Brief/Interim Summary: Per admitting physician: Veronica Smithis a 78 y.o.femalewith medical history significant oflung cancer status post lobectomy/chemo, essential hypertension was brought to the hospital for evaluation of syncope. Veronica Grimes is visiting out of town and is living in a motel where her family thought she passed out and called EMS. Apparently just before this she had reported some epigastric pain after eating some Popeye. No other complaints.  In the ER her basic lab work showed hypokalemia. CTA of the chest and carotid Dopplers were positive for left subclavian vein thrombosis. It also showed previous lobectomy with spiculated mass. She was started on anticoagulation and medical team was requested to admit the Veronica Grimes.  When I saw her she was comfortable and did not have any new complaints. Her nausea and abdominal discomfort have resolved.Previously her lung cancer was treated in [place] at least 5 years ago,she had also underwent lobectomy.According to her she has been in remission  Hospital course: Left subclavian vein thrombosis with a history of lung cancer and CTA chest concerning for spiculated mass.  Veronica Grimes was initially placed on Lovenox, which he tolerated complication.  She is converted over to p.o. Eliquis with starter pack given of 10 mg p.o. twice daily x7 days and 5 mg p.o. twice daily thereafter.  Veronica Grimes will follow closely with her doctors in [place].  Veronica Grimes reports that that is where she is  currently being treated.  Also discussed with the Veronica Grimes his CTA findings concerning for spiculated mass on lung concerning because of prior history of lung cancer with treatment.  Veronica Grimes is aware and understands importance of close follow-up upon returning to [facility].  Veronica Grimes did not want to remain in the hospital for further work-up or evaluation while here in [place].  While in the hospital Veronica Grimes's low potassium was treated, she was also noted to have sinus tachycardia which is consistent with subsequent findings as well as what appears to be some mild anxiety.  Veronica Grimes will continue home medication on discharge.  Report of syncope which is likely vasovagal.  Echocardiogram was normal Veronica Grimes had no focal neurological findings, she was gently hydrated without further complications.  Veronica Grimes felt at her baseline, requested to be discharged home so she can follow-up with her doctors in [place].  Addendum: Veronica Grimes remained in hospital additional night,  reported that she felt a headache and somewhat nauseous and did not feel ready to be discharged.  She was observed overnight, reported this morning she feels closer to baseline.  Veronica Grimes stable for discharge.  Discharge Diagnoses:  Principal Problem:   Subclavian vein thrombosis (HCC) Active Problems:   Lung mass   CAD (coronary artery disease)   Hilar lymphadenopathy   Sinus tachycardia   Benign essential HTN   Hypokalemia   Syncope    Discharge Instructions  Discharge Instructions    Call MD for:  difficulty breathing, headache or visual disturbances   Complete by: As directed    Call MD for:  extreme fatigue   Complete by: As directed    Call MD for:  hives   Complete by: As directed    Call MD for:  persistant dizziness or light-headedness   Complete by: As directed    Call MD for:  persistant nausea and vomiting   Complete by: As directed    Call MD for:  severe uncontrolled pain   Complete by: As  directed    Call MD for:  temperature >100.4   Complete by: As directed    Diet - low sodium heart healthy   Complete by: As directed    Diet - low sodium heart healthy   Complete by: As directed    Increase activity slowly   Complete by: As directed    Increase activity slowly   Complete by: As directed      Allergies as of 02/09/2019   No Known Allergies     Medication List    TAKE these medications   apixaban 5 MG Tabs tablet Commonly known as: Eliquis Take 2 tablets (10mg ) twice daily for 7 days, then 1 tablet (5mg ) twice daily   benazepril 20 MG tablet Commonly known as: LOTENSIN Take 20 mg by mouth daily.   hydrochlorothiazide 12.5 MG tablet Commonly known as: HYDRODIURIL Take 12.5 mg by mouth daily.   omeprazole 20 MG capsule Commonly known as: PRILOSEC Take 20 mg by mouth daily.   promethazine 25 MG tablet Commonly known as: PHENERGAN Take 25 mg by mouth every 4 (four) hours as needed.   sucralfate 1 g tablet Commonly known as: CARAFATE Take 1 g by mouth 4 (four) times daily.       No Known Allergies  Consultations:  None   Procedures/Studies: Dg Chest 2 View  Result Date: 02/07/2019 CLINICAL DATA:  Chest pain, history of lung cancer EXAM: CHEST - 2 VIEW COMPARISON:  None. FINDINGS: There are postsurgical changes in the right lung with more masslike areas of opacity in the right upper lobe and left mid lung. There is some more patchy airspace opacity present as well. There is a loculated right pleural effusion tracking within the fissures and with resulting apical capping. Mild cardiomegaly. No pneumothorax. Cholecystectomy clips in the right upper quadrant. No acute osseous or soft tissue abnormality. Degenerative changes are present in the imaged spine and shoulders. IMPRESSION: 1. Postoperative changes in the right lung likely from prior lobectomy are wedge resection. Correlate with surgical history. 2. Masslike areas of opacity in the right upper  lobe and left mid lung worrisome for malignancy given Veronica Grimes's history of lung cancer. Additional ill-defined areas of opacity could suggest infection as well. 3. Loculated right pleural effusion tracking within the fissures and resulting apical capping. Electronically Signed   By: Lovena Le M.D.   On: 02/07/2019 02:28   Ct Angio Chest Pe W/cm &/or Wo Cm  Result Date: 02/07/2019 CLINICAL DATA:  Rule out PE, abnormal chest CT EXAM: CT ANGIOGRAPHY CHEST WITH CONTRAST TECHNIQUE: Multidetector CT imaging of the chest was performed using the standard protocol during bolus administration of intravenous contrast. Multiplanar CT image reconstructions and MIPs were obtained to evaluate the vascular anatomy. CONTRAST:  50mL OMNIPAQUE IOHEXOL 350 MG/ML SOLN COMPARISON:  Same-day radiograph FINDINGS: Cardiovascular: Satisfactory opacification of the pulmonary arteries to the segmental level. No pulmonary arterial filling defects are identified. There is central pulmonary artery enlargement but without elevation of the RV/LV ratio (0.8). Mild cardiomegaly. Coronary artery calcifications are noted. Small volume of pericardial fluid. Calcifications on the aortic leaflets. Normal caliber thoracic aorta. Atherosclerotic calcification present throughout the thoracic aorta and normally branching three-vessel arch. Note is made of extensive venous reflux of contrast  in the mediastinum. Injection of contrast is from the left upper extremity. There is a smooth linear filling defect in the region of the left subclavian vein worrisome for venous thrombosis. No other filling defects are identified. Mediastinum/Nodes: There is extensive mediastinal and hilar adenopathy including a large conglomerate subcarinal nodal mass measuring up to 2.8 cm (4/42) and additional right paratracheal lymph node measuring up to 11 mm (4/19) and some associated hilar nodal masses. There are postsurgical changes with truncation right upper lobe  pulmonary artery and vein suggesting prior right upper lobectomy. Extensive confluent, spiculated soft tissue attenuation extends from the resection margin into the lung tissue measuring approximately 4.3 x 3.9 cm in size. Lungs/Pleura: There are multifocal masslike opacities throughout the lungs including a partially cavitary lesion in the superior segment left lower lobe measuring up to 1.7 cm in size (4/52) largest lesion is a bilobed subpleural mass in the superior segment right lower lobe measuring up to 4.9 cm in maximal diameter. Additional masses are seen throughout the remaining lobes of the lung as well as along the mediastinal border in the left upper lobe (4/24). No convincing features of superimposed infection at this time. Upper Abdomen: Veronica Grimes is post cholecystectomy. Few fluid attenuation cystic lesions are present in the right kidney. No acute abnormalities present in the visualized portions of the upper abdomen. Musculoskeletal: The osseous structures appear diffusely demineralized which may limit detection of small or nondisplaced fractures. Multilevel degenerative changes are present in the imaged portions of the spine. Deformity of the right posterior fifth and sixth ribs may be related to prior thoracotomy. Review of the MIP images confirms the above findings. IMPRESSION: 1. No evidence of pulmonary artery emboli. 2. Linear filling defect seen in the left subclavian vein potentially extending from the left internal jugular vein. Findings worrisome for venous thrombosis though a laminar flow artifact could present with a similar appearance. Consider further evaluation with left upper extremity venous Doppler 3. Evidence of what appears to be a prior right upper lobectomy with extensive spiculated soft tissue attenuation along the resection margin concerning for locally recurrent disease. Numerous additional spiculated masses and nodules throughout the remaining portions of the lungs are  compatible with metastatic disease. Additional mediastinal hilar adenopathy is noted as well. 4. Aortic atherosclerosis. Coronary artery disease. 5. Deformity of the right posterior fifth and sixth ribs may be related to prior thoracotomy. Aortic Atherosclerosis (ICD10-I70.0). These results were called by telephone at the time of interpretation on 02/07/2019 at 3:57 am to provider [facility] , who verbally acknowledged these results. Electronically Signed   By: Lovena Le M.D.   On: 02/07/2019 03:58   US Venous Img Upper Left (dvt Study)  Result Date: 02/07/2019 CLINICAL DATA:  Possible DVT on CT EXAM: LEFT UPPER EXTREMITY VENOUS DOPPLER ULTRASOUND TECHNIQUE: Gray-scale sonography with graded compression, as well as color Doppler and duplex ultrasound were performed to evaluate the upper extremity deep venous system from the level of the subclavian vein and including the jugular, axillary, basilic, radial, ulnar and upper cephalic vein. Spectral Doppler was utilized to evaluate flow at rest and with distal augmentation maneuvers. COMPARISON:  CT angiography 02/07/2019 FINDINGS: Contralateral Subclavian Vein: Respiratory phasicity is normal and symmetric with the symptomatic side. No evidence of thrombus. Normal compressibility. Internal Jugular Vein: No evidence of thrombus. Normal compressibility, respiratory phasicity and response to augmentation. Subclavian Vein: There is some echogenic, nonocclusive thrombus in the left subclavian vein. Extension to the IJ is not visualized on this sonographic  exam. Normal respiratory phasicity. Axillary Vein: No evidence of thrombus. Normal compressibility, respiratory phasicity and response to augmentation. Cephalic Vein: No evidence of thrombus. Normal compressibility, respiratory phasicity and response to augmentation. Basilic Vein: No evidence of thrombus. Normal compressibility, respiratory phasicity and response to augmentation. Brachial Veins: No evidence of  thrombus. Normal compressibility, respiratory phasicity and response to augmentation. Radial Veins: No evidence of thrombus. Normal compressibility, respiratory phasicity and response to augmentation. Ulnar Veins: No evidence of thrombus. Normal compressibility, respiratory phasicity and response to augmentation. Venous Reflux:  None visualized. Other Findings:  None visualized. IMPRESSION: Echogenic nonocclusive thrombus is seen at the left subclavian vein. Increased echogenicity in linear configuration may reflect a more chronic thrombus or potential fibrin sheath Veronica Grimes has had a history long-term venous access though acute clot cannot be fully excluded. These results were called by telephone at the time of interpretation on 02/07/2019 at 5:55 am to provider [facility] , who verbally acknowledged these results. Electronically Signed   By: Lovena Le M.D.   On: 02/07/2019 05:57       Subjective: Reported a headache and nausea prior to discharge yesterday Result discharge was canceled Veronica Grimes remains in the hospital for observation overnight Reports she feels somewhat better this morning, stable for discharge.  Discharge Exam: Vitals:   02/08/19 2005 02/09/19 0443  BP: 121/70 (!) 154/59  Pulse: 99 92  Resp: 20 20  Temp: 98 F (36.7 C) 98.4 F (36.9 C)  SpO2: 100% 100%   Vitals:   02/08/19 0426 02/08/19 1308 02/08/19 2005 02/09/19 0443  BP:  (!) 153/83 121/70 (!) 154/59  Pulse:  88 99 92  Resp:  18 20 20   Temp:  98.6 F (37 C) 98 F (36.7 C) 98.4 F (36.9 C)  TempSrc:  Oral Oral Oral  SpO2:  100% 100% 100%  Weight: 77.3 kg   76 kg  Height:        General: Pt is alert, awake, not in acute distress Cardiovascular: RRR, S1/S2 +, no rubs, no gallops Respiratory: CTA bilaterally, no wheezing, no rhonchi Abdominal: Soft, NT, ND, bowel sounds + Extremities: no edema, no cyanosis    The results of significant diagnostics from this hospitalization (including imaging,  microbiology, ancillary and laboratory) are listed below for reference.     Microbiology: Recent Results (from the past 240 hour(s))  SARS CORONAVIRUS 2 (TAT 6-24 HRS) Nasopharyngeal Nasopharyngeal Swab     Status: None   Collection Time: 02/07/19  6:43 AM   Specimen: Nasopharyngeal Swab  Result Value Ref Range Status   SARS Coronavirus 2 NEGATIVE NEGATIVE Final    Comment: (NOTE) SARS-CoV-2 target nucleic acids are NOT DETECTED. The SARS-CoV-2 RNA is generally detectable in upper and lower respiratory specimens during the acute phase of infection. Negative results do not preclude SARS-CoV-2 infection, do not rule out co-infections with other pathogens, and should not be used as the sole basis for treatment or other Veronica Grimes management decisions. Negative results must be combined with clinical observations, Veronica Grimes history, and epidemiological information. The expected result is Negative. Fact Sheet for Patients: SugarRoll.be Fact Sheet for Healthcare Providers: https://www.woods-mathews.com/ This test is not yet approved or cleared by the [place] FDA and  has been authorized for detection and/or diagnosis of SARS-CoV-2 by FDA under an Emergency Use Authorization (EUA). This EUA will remain  in effect (meaning this test can be used) for the duration of the COVID-19 declaration under Section 56 4(b)(1) of the Act, 21 U.S.C. section 360bbb-3(b)(1), unless  the authorization is terminated or revoked sooner. Performed at [facility], [place] [address] [place], [place] [address]      Labs: BNP (last 3 results) No results for input(s): BNP in the last 8760 hours. Basic Metabolic Panel: Recent Labs  Lab 02/06/19 1919 02/08/19 0427 02/09/19 0429  NA 139 140  --   K 3.2* 3.3*  --   CL 102 107  --   CO2 24 21*  --   GLUCOSE 126* 85  --   BUN 18 26*  --   CREATININE 1.07* 1.12*  --   CALCIUM 9.7 9.2  --   MG  --  1.8 1.6*    Liver Function Tests: Recent Labs  Lab 02/06/19 1919 02/08/19 0427  AST 17 14*  ALT 9 8  ALKPHOS 85 68  BILITOT 0.6 0.8  PROT 9.0* 7.7  ALBUMIN 3.6 3.1*   Recent Labs  Lab 02/06/19 1919  LIPASE 26   No results for input(s): AMMONIA in the last 168 hours. CBC: Recent Labs  Lab 02/06/19 1919 02/08/19 0427 02/09/19 0429  WBC 8.9 5.5 4.5  HGB 10.3* 8.7* 9.0*  HCT 32.2* 26.4* 27.4*  MCV 86.8 83.8 83.0  PLT 260 221 229   Cardiac Enzymes: No results for input(s): CKTOTAL, CKMB, CKMBINDEX, TROPONINI in the last 168 hours. BNP: Invalid input(s): POCBNP CBG: Recent Labs  Lab 02/08/19 1633 02/08/19 2103 02/09/19 0749 02/09/19 0752 02/09/19 1139  GLUCAP 80 122* 92 77 91   D-Dimer No results for input(s): DDIMER in the last 72 hours. Hgb A1c Recent Labs    02/07/19 1124  HGBA1C 5.8*   Lipid Profile No results for input(s): CHOL, HDL, LDLCALC, TRIG, CHOLHDL, LDLDIRECT in the last 72 hours. Thyroid function studies Recent Labs    02/07/19 0915  TSH 0.860   Anemia work up No results for input(s): VITAMINB12, FOLATE, FERRITIN, TIBC, IRON, RETICCTPCT in the last 72 hours. Urinalysis No results found for: COLORURINE, APPEARANCEUR, [place], [place], [place], [place], [place], [place], PROTEINUR, UROBILINOGEN, NITRITE, LEUKOCYTESUR Sepsis Labs Invalid input(s): PROCALCITONIN,  WBC,  LACTICIDVEN Microbiology Recent Results (from the past 240 hour(s))  SARS CORONAVIRUS 2 (TAT 6-24 HRS) Nasopharyngeal Nasopharyngeal Swab     Status: None   Collection Time: 02/07/19  6:43 AM   Specimen: Nasopharyngeal Swab  Result Value Ref Range Status   SARS Coronavirus 2 NEGATIVE NEGATIVE Final    Comment: (NOTE) SARS-CoV-2 target nucleic acids are NOT DETECTED. The SARS-CoV-2 RNA is generally detectable in upper and lower respiratory specimens during the acute phase of infection. Negative results do not preclude SARS-CoV-2 infection, do not rule out co-infections with  other pathogens, and should not be used as the sole basis for treatment or other Veronica Grimes management decisions. Negative results must be combined with clinical observations, Veronica Grimes history, and epidemiological information. The expected result is Negative. Fact Sheet for Patients: SugarRoll.be Fact Sheet for Healthcare Providers: https://www.woods-mathews.com/ This test is not yet approved or cleared by the [place] FDA and  has been authorized for detection and/or diagnosis of SARS-CoV-2 by FDA under an Emergency Use Authorization (EUA). This EUA will remain  in effect (meaning this test can be used) for the duration of the COVID-19 declaration under Section 56 4(b)(1) of the Act, 21 U.S.C. section 360bbb-3(b)(1), unless the authorization is terminated or revoked sooner. Performed at [facility], [place] [address] [place], [place] [address]      Time coordinating discharge: Over 30 minutes  SIGNED:  Nicolette Bang, MD  [facility] 02/09/2019, 12:05 PM Pager   If 7PM-7AM, please contact night-coverage [email or web address] Password [email or web address]

## 2019-06-13 DEATH — deceased
